# Patient Record
Sex: Female | Born: 1960 | Race: White | Hispanic: No | Smoking: Current every day smoker
Health system: Southern US, Community
[De-identification: ages and names within clinical notes are randomized; demographics above are authoritative.]

## PROBLEM LIST (undated history)

## (undated) DIAGNOSIS — F419 Anxiety disorder, unspecified: Secondary | ICD-10-CM

## (undated) DIAGNOSIS — I10 Essential (primary) hypertension: Secondary | ICD-10-CM

## (undated) DIAGNOSIS — F329 Major depressive disorder, single episode, unspecified: Secondary | ICD-10-CM

## (undated) DIAGNOSIS — E079 Disorder of thyroid, unspecified: Secondary | ICD-10-CM

## (undated) DIAGNOSIS — G43909 Migraine, unspecified, not intractable, without status migrainosus: Secondary | ICD-10-CM

## (undated) DIAGNOSIS — F32A Depression, unspecified: Secondary | ICD-10-CM

## (undated) DIAGNOSIS — R569 Unspecified convulsions: Secondary | ICD-10-CM

## (undated) DIAGNOSIS — R51 Headache: Secondary | ICD-10-CM

## (undated) HISTORY — PX: ABDOMINAL HYSTERECTOMY: SHX81

## (undated) HISTORY — DX: Essential (primary) hypertension: I10

## (undated) HISTORY — DX: Headache: R51

## (undated) HISTORY — DX: Major depressive disorder, single episode, unspecified: F32.9

## (undated) HISTORY — DX: Depression, unspecified: F32.A

## (undated) HISTORY — PX: BREAST LUMPECTOMY: SHX2

## (undated) HISTORY — PX: CHOLECYSTECTOMY: SHX55

---

## 2003-03-23 ENCOUNTER — Emergency Department (HOSPITAL_COMMUNITY): Admission: EM | Admit: 2003-03-23 | Discharge: 2003-03-24 | Payer: Self-pay | Admitting: Emergency Medicine

## 2012-12-07 ENCOUNTER — Emergency Department (INDEPENDENT_AMBULATORY_CARE_PROVIDER_SITE_OTHER)
Admission: EM | Admit: 2012-12-07 | Discharge: 2012-12-07 | Disposition: A | Discharge status: Home / Self Care | Payer: PRIVATE HEALTH INSURANCE | Source: Home / Self Care | Attending: Emergency Medicine | Admitting: Emergency Medicine

## 2012-12-07 ENCOUNTER — Encounter (HOSPITAL_COMMUNITY): Payer: Self-pay

## 2012-12-07 DIAGNOSIS — G40909 Epilepsy, unspecified, not intractable, without status epilepticus: Secondary | ICD-10-CM

## 2012-12-07 HISTORY — DX: Disorder of thyroid, unspecified: E07.9

## 2012-12-07 HISTORY — DX: Anxiety disorder, unspecified: F41.9

## 2012-12-07 HISTORY — DX: Unspecified convulsions: R56.9

## 2012-12-07 HISTORY — DX: Migraine, unspecified, not intractable, without status migrainosus: G43.909

## 2012-12-07 MED ORDER — ZIPRASIDONE HCL 40 MG PO CAPS: 40.0000 mg | ORAL_CAPSULE | Freq: Two times a day (BID) | ORAL | Status: DC

## 2012-12-07 MED ORDER — LEVOTHYROXINE SODIUM 75 MCG PO TABS: 75.0000 ug | ORAL_TABLET | Freq: Every day | ORAL | Status: DC

## 2012-12-07 MED ORDER — TOPIRAMATE 100 MG PO TABS: 150.0000 mg | ORAL_TABLET | Freq: Two times a day (BID) | ORAL | Status: DC

## 2012-12-07 MED ORDER — ATENOLOL 25 MG PO TABS: 25.0000 mg | ORAL_TABLET | Freq: Every day | ORAL | Status: DC

## 2012-12-07 NOTE — ED Notes (Signed)
Discussed limitations in the [facility] for long term controlled substance Rx, and need for follow up w a PCP for her psych medications and pain control issuse

## 2012-12-07 NOTE — ED Provider Notes (Signed)
CSN: [id number]     Arrival date & time 12/07/12  1403 History   First MD Initiated Contact with Patient 12/07/12 1553     Chief Complaint  Patient presents with  . Medication Refill   (Consider location/radiation/quality/duration/timing/severity/associated sxs/prior Treatment) Patient is a 52 y.o. female presenting with seizures. The history is provided by the patient. No language interpreter was used.  Seizures History of seizures: yes   Pt reports she moved here from [place].  Pt reports she did not bring her medications.  Pt is requesting medications  Past Medical History  Diagnosis Date  . Thyroid disease   . Seizures   . Anxiety   . Migraine    Past Surgical History  Procedure Laterality Date  . Abdominal hysterectomy    . Cholecystectomy    . Breast lumpectomy     History reviewed. No pertinent family history. History  Substance Use Topics  . Smoking status: Current Every Day Smoker  . Smokeless tobacco: Not on file  . Alcohol Use: No   OB History   Grav Para Term Preterm Abortions TAB SAB Ect Mult Living                 Review of Systems  Neurological: Positive for seizures.  All other systems reviewed and are negative.    Allergies  Penicillins and Sulfa antibiotics  Home Medications   Current Outpatient Rx  Name  Route  Sig  Dispense  Refill  . ALPRAZolam (XANAX) 1 MG tablet   Oral   Take 1 mg by mouth 3 (three) times daily as needed for sleep.         Marland Kitchen HYDROcodone-acetaminophen (LORCET) 10-650 MG per tablet   Oral   Take 1 tablet by mouth every 6 (six) hours as needed for pain.         Marland Kitchen atenolol (TENORMIN) 25 MG tablet   Oral   Take 1 tablet (25 mg total) by mouth daily.   15 tablet   0   . levothyroxine (SYNTHROID, LEVOTHROID) 75 MCG tablet   Oral   Take 1 tablet (75 mcg total) by mouth daily before breakfast.   15 tablet   0   . topiramate (TOPAMAX) 100 MG tablet   Oral   Take 1.5 tablets (150 mg total) by mouth 2 (two) times  daily.   30 tablet   0   . ziprasidone (GEODON) 40 MG capsule   Oral   Take 1 capsule (40 mg total) by mouth 2 (two) times daily with a meal.   30 capsule   0    BP 132/91  Pulse 79  Temp(Src) 97.9 F (36.6 C) (Oral)  Resp 12  SpO2 97% Physical Exam  Vitals reviewed. Constitutional: She appears well-developed and well-nourished.  HENT:  Head: Normocephalic.  Cardiovascular: Normal rate.   Pulmonary/Chest: Effort normal.  Musculoskeletal: Normal range of motion.  Neurological: She is alert.  Skin: Skin is warm.    ED Course  Procedures (including critical care time) Labs Review Labs Reviewed - No data to display Imaging Review No results found.  MDM   1. Seizure disorder     I discussed with Dr. Artis Flock.   I will give pt 2 week supply of noncontrolled medication.   I advised her this Urgent care will not prescribe the xanax or hydrocodone.   Pt advised she needs to get a primary MD here to manage her.      Elson Areas, PA-C 12/07/12  1621 

## 2012-12-07 NOTE — ED Notes (Signed)
Here from [place], needs Rx

## 2012-12-07 NOTE — ED Provider Notes (Signed)
Medical screening examination/treatment/procedure(s) were performed by resident physician or non-physician practitioner and as supervising physician I was immediately available for consultation/collaboration.   Tadeo Besecker DOUGLAS MD.   Jerrick Farve D Jaydan Meidinger, MD 12/07/12 1718 

## 2013-01-31 ENCOUNTER — Encounter: Payer: Self-pay | Admitting: Neurology

## 2013-02-01 ENCOUNTER — Ambulatory Visit (INDEPENDENT_AMBULATORY_CARE_PROVIDER_SITE_OTHER): Payer: Medicaid Other | Admitting: Neurology

## 2013-02-01 ENCOUNTER — Encounter: Payer: Self-pay | Admitting: Neurology

## 2013-02-01 ENCOUNTER — Encounter (INDEPENDENT_AMBULATORY_CARE_PROVIDER_SITE_OTHER): Payer: Self-pay

## 2013-02-01 VITALS — BP 144/84 | HR 68 | Ht 61.0 in | Wt 130.0 lb

## 2013-02-01 DIAGNOSIS — R51 Headache: Secondary | ICD-10-CM

## 2013-02-01 DIAGNOSIS — R569 Unspecified convulsions: Secondary | ICD-10-CM

## 2013-02-01 DIAGNOSIS — R519 Headache, unspecified: Secondary | ICD-10-CM | POA: Insufficient documentation

## 2013-02-01 HISTORY — DX: Headache: R51

## 2013-02-01 MED ORDER — QUETIAPINE FUMARATE 50 MG PO TABS: ORAL_TABLET | ORAL | Status: DC

## 2013-02-01 NOTE — Patient Instructions (Signed)

## 2013-02-01 NOTE — Progress Notes (Signed)
Reason for visit: Headache  Sherri Collins is a 52 y.o. female  History of present illness:  Sherri Collins is a 52 year old right-handed white female with a history of significant psychiatric illness. The patient has depression and anxiety, and she indicates that she has been hospitalized in the past for this. The patient last was in the hospital about one year ago. The patient comes into our office indicating that she has a history of headaches that are daily in nature. The patient indicates that the headaches have been present throughout her entire life starting as a child. The patient has a sensation as if her head is detached from her body, with a balloon-like sensation in the head with occasional squeezing sensations in the front of the head. The headaches are generalized in nature, sometimes associated with neck stiffness. The patient indicates that she may occasionally have some nausea with the headache. The patient has moved from [place], and she was taking Lorcet and Xanax at that time, but this has not been continued locally. The patient reports tingling sensations throughout the body. The patient reports a history of stress induced seizures that may be generalized in nature, with the last seizure on 12/05/2012. The patient is on Topamax taking 200 mg twice daily. The patient also takes gabapentin 300 mg 4 times daily. The patient is sent to this office for further evaluation. The patient has history of concussions.  Past Medical History  Diagnosis Date  . Thyroid disease   . Seizures   . Anxiety   . Migraine   . Headache(784.0) 02/01/2013  . Depression   . Hypertension     Past Surgical History  Procedure Laterality Date  . Abdominal hysterectomy    . Cholecystectomy    . Breast lumpectomy      Family History  Problem Relation Age of Onset  . Adopted: Yes    Social history:  reports that she has been smoking.  She has never used smokeless tobacco. She reports that she  does not drink alcohol. Her drug history is not on file.  Medications:  Current Outpatient Prescriptions on File Prior to Visit  Medication Sig Dispense Refill  . atenolol (TENORMIN) 25 MG tablet Take 1 tablet (25 mg total) by mouth daily.  15 tablet  0  . levothyroxine (SYNTHROID, LEVOTHROID) 75 MCG tablet Take 1 tablet (75 mcg total) by mouth daily before breakfast.  15 tablet  0   No current facility-administered medications on file prior to visit.      Allergies  Allergen Reactions  . Penicillins Swelling  . Sulfa Antibiotics Swelling    ROS:  Out of a complete 14 system review of symptoms, the patient complains only of the following symptoms, and all other reviewed systems are negative.  Memory loss, headache, numbness, slurred speech, seizure, passing out Insomnia Depression, anxiety, suicidal thoughts, hallucinations, racing thoughts  Blood pressure 144/84, pulse 68, height 5\' 1"  (1.549 m), weight 130 lb (58.968 kg).  Physical Exam  General: The patient is alert and cooperative at the time of the examination.  Head: Pupils are equal, round, and reactive to light. Discs are flat bilaterally.  Neck: The neck is supple, no carotid bruits are noted.  Respiratory: The respiratory examination is clear.  Cardiovascular: The cardiovascular examination reveals a regular rate and rhythm, no obvious murmurs or rubs are noted.  Neuromuscular: Range of movement of the cervical spine is full and normal. No crepitus is noted in the temporomandibular joints.  Skin:  Extremities are without significant edema.  Neurologic Exam  Mental status: The patient is alert and oriented x 3.  Cranial nerves: Facial symmetry is present. There is good sensation of the face to pinprick and soft touch bilaterally. The strength of the facial muscles and the muscles to head turning and shoulder shrug are normal bilaterally. Speech is well enunciated, no aphasia or dysarthria is noted. Extraocular  movements are full. Visual fields are full.  Motor: The motor testing reveals 5 over 5 strength of all 4 extremities. Good symmetric motor tone is noted throughout.  Sensory: Sensory testing is intact to pinprick, soft touch, vibration sensation, and position sense on all 4 extremities. No evidence of extinction is noted.  Coordination: Cerebellar testing reveals good finger-nose-finger and heel-to-shin bilaterally.  Gait and station: Gait is normal. Tandem gait is normal. Romberg is negative. No drift is seen.  Reflexes: Deep tendon reflexes are symmetric and normal bilaterally. Toes are downgoing bilaterally.   Assessment/Plan:  1. Muscle tension headache  2. Depression and anxiety  The patient has significant psychiatric disease that is underlying her chronic pain syndrome. The patient reports some occasional hallucinations. The patient has had suicidal ideation in the past, but she denies any recent issues in this regard. The patient has insomnia. For this reason, the patient will be started on Seroquel for her sleep disorder, for her depression, and for her muscle tension headache. The patient will followup through this office in 3-4 months. The patient will contact our office if dosing adjustments need to be done. Use of potentially addictive medication such as hydrocodone is not indicated.   Marlan Palau MD 02/01/2013 5:58 PM  [facility] [address] [place], [place] [address]  Phone [phone] Fax [phone]

## 2013-03-10 ENCOUNTER — Encounter (HOSPITAL_COMMUNITY): Payer: Self-pay | Admitting: Emergency Medicine

## 2013-03-10 ENCOUNTER — Emergency Department (HOSPITAL_COMMUNITY): Payer: Medicaid Other

## 2013-03-10 ENCOUNTER — Emergency Department (HOSPITAL_COMMUNITY)
Admission: EM | Admit: 2013-03-10 | Discharge: 2013-03-10 | Disposition: A | Discharge status: Home / Self Care | Payer: Medicaid Other | Attending: Emergency Medicine | Admitting: Emergency Medicine

## 2013-03-10 DIAGNOSIS — E079 Disorder of thyroid, unspecified: Secondary | ICD-10-CM | POA: Insufficient documentation

## 2013-03-10 DIAGNOSIS — Z882 Allergy status to sulfonamides status: Secondary | ICD-10-CM | POA: Insufficient documentation

## 2013-03-10 DIAGNOSIS — I10 Essential (primary) hypertension: Secondary | ICD-10-CM | POA: Insufficient documentation

## 2013-03-10 DIAGNOSIS — H9209 Otalgia, unspecified ear: Secondary | ICD-10-CM | POA: Insufficient documentation

## 2013-03-10 DIAGNOSIS — J40 Bronchitis, not specified as acute or chronic: Secondary | ICD-10-CM

## 2013-03-10 DIAGNOSIS — J209 Acute bronchitis, unspecified: Secondary | ICD-10-CM | POA: Insufficient documentation

## 2013-03-10 DIAGNOSIS — Z79899 Other long term (current) drug therapy: Secondary | ICD-10-CM | POA: Insufficient documentation

## 2013-03-10 DIAGNOSIS — J019 Acute sinusitis, unspecified: Secondary | ICD-10-CM | POA: Insufficient documentation

## 2013-03-10 DIAGNOSIS — F17200 Nicotine dependence, unspecified, uncomplicated: Secondary | ICD-10-CM | POA: Insufficient documentation

## 2013-03-10 DIAGNOSIS — R062 Wheezing: Secondary | ICD-10-CM | POA: Insufficient documentation

## 2013-03-10 DIAGNOSIS — Z8669 Personal history of other diseases of the nervous system and sense organs: Secondary | ICD-10-CM | POA: Insufficient documentation

## 2013-03-10 DIAGNOSIS — F329 Major depressive disorder, single episode, unspecified: Secondary | ICD-10-CM | POA: Insufficient documentation

## 2013-03-10 DIAGNOSIS — F411 Generalized anxiety disorder: Secondary | ICD-10-CM | POA: Insufficient documentation

## 2013-03-10 DIAGNOSIS — J329 Chronic sinusitis, unspecified: Secondary | ICD-10-CM

## 2013-03-10 DIAGNOSIS — Z880 Allergy status to penicillin: Secondary | ICD-10-CM | POA: Insufficient documentation

## 2013-03-10 DIAGNOSIS — M546 Pain in thoracic spine: Secondary | ICD-10-CM | POA: Insufficient documentation

## 2013-03-10 DIAGNOSIS — F3289 Other specified depressive episodes: Secondary | ICD-10-CM | POA: Insufficient documentation

## 2013-03-10 DIAGNOSIS — R569 Unspecified convulsions: Secondary | ICD-10-CM | POA: Insufficient documentation

## 2013-03-10 DIAGNOSIS — H9190 Unspecified hearing loss, unspecified ear: Secondary | ICD-10-CM | POA: Insufficient documentation

## 2013-03-10 LAB — CBC
Hemoglobin: 14.4 g/dL (ref 12.0–15.0)
MCH: 30.3 pg (ref 26.0–34.0)
RBC: 4.75 MIL/uL (ref 3.87–5.11)

## 2013-03-10 MED ORDER — ALBUTEROL SULFATE HFA 108 (90 BASE) MCG/ACT IN AERS
2.0000 | INHALATION_SPRAY | RESPIRATORY_TRACT | Status: DC | PRN
  Administered 2013-03-10: 2 via RESPIRATORY_TRACT
  Filled 2013-03-10: qty 6.7

## 2013-03-10 MED ORDER — HYDROCOD POLST-CHLORPHEN POLST 10-8 MG/5ML PO LQCR: 5.0000 mL | Freq: Two times a day (BID) | ORAL | Status: DC | PRN

## 2013-03-10 MED ORDER — AZITHROMYCIN 250 MG PO TABS: ORAL_TABLET | ORAL | Status: DC

## 2013-03-10 MED ORDER — ALBUTEROL SULFATE (5 MG/ML) 0.5% IN NEBU
5.0000 mg | INHALATION_SOLUTION | Freq: Once | RESPIRATORY_TRACT | Status: AC
  Administered 2013-03-10: 5 mg via RESPIRATORY_TRACT
  Filled 2013-03-10: qty 1

## 2013-03-10 MED ORDER — ACETAMINOPHEN 325 MG PO TABS
650.0000 mg | ORAL_TABLET | Freq: Once | ORAL | Status: AC
  Administered 2013-03-10: 650 mg via ORAL
  Filled 2013-03-10: qty 2

## 2013-03-10 NOTE — ED Provider Notes (Signed)
Medical screening examination/treatment/procedure(s) were conducted as a shared visit with non-physician practitioner(s) and myself.  I personally evaluated the patient during the encounter.  EKG Interpretation    Date/Time:  Friday March 10 2013 13:53:30 EST Ventricular Rate:  64 PR Interval:  141 QRS Duration: 87 QT Interval:  441 QTC Calculation: 455 R Axis:   64 Text Interpretation:  Sinus rhythm Baseline wander in lead(s) V3 No old tracing to compare Confirmed by [facility]  MD, CHARLES [phone]) on 03/10/2013 2:20:05 PM            Pt with bronchitis, likely sinusitis. No concern for clinically significant hemoptysis. CXR is clear. No anemia or leukocytosis. HFA, Z-pak, cough medicine and PCP followup.  Charles B. Bernette Mayers, MD 03/10/13 [phone]

## 2013-03-10 NOTE — ED Notes (Signed)
Pt c/o URI sx with cough and congestion and productive cough x 2 weeks; pt sts pain in bilateral ear and congestion with difficulty hearing

## 2013-03-10 NOTE — ED Provider Notes (Signed)
CSN: [id number]     Arrival date & time 03/10/13  1010 History   First MD Initiated Contact with Patient 03/10/13 1240    This chart was scribed for Mellody Drown PA-C, a non-physician practitioner working with Laray Anger, DO by Lewanda Rife, ED Scribe. This patient was seen in room TR06C/TR06C and the patient's care was started at 12:56 PM     Chief Complaint  Patient presents with  . URI  . Otalgia   (Consider location/radiation/quality/duration/timing/severity/associated sxs/prior Treatment) The history is provided by the patient. No language interpreter was used.   HPI Comments: Sherri Collins is a 52 y.o. female who presents to the Emergency Department complaining of several episodes of productive cough with brown sputum onset 2 weeks. Describes sputum as having "blood clots". Reports associated bilateral otalgia, nasal congestion, difficulty hearing, right upper back pain, and sinus pressure. Denies any aggravating or alleviating factors. Denies associated fever, emesis, nausea, and abdominal pain. Reports she smokes cigarettes daily (about 1 pack every 2 days). Past Medical History  Diagnosis Date  . Thyroid disease   . Seizures   . Anxiety   . Migraine   . Headache(784.0) 02/01/2013  . Depression   . Hypertension    Past Surgical History  Procedure Laterality Date  . Abdominal hysterectomy    . Cholecystectomy    . Breast lumpectomy     Family History  Problem Relation Age of Onset  . Adopted: Yes   History  Substance Use Topics  . Smoking status: Current Every Day Smoker  . Smokeless tobacco: Never Used  . Alcohol Use: No   OB History   Grav Para Term Preterm Abortions TAB SAB Ect Mult Living                 Review of Systems  Constitutional: Negative for fever.  HENT: Positive for congestion, hearing loss and sinus pressure.   Respiratory: Positive for cough.    A complete 10 system review of systems was obtained and all systems are  negative except as noted in the HPI and PMHx.    Allergies  Penicillins and Sulfa antibiotics  Home Medications   Current Outpatient Rx  Name  Route  Sig  Dispense  Refill  . atenolol (TENORMIN) 25 MG tablet   Oral   Take 1 tablet (25 mg total) by mouth daily.   15 tablet   0   . levothyroxine (SYNTHROID, LEVOTHROID) 75 MCG tablet   Oral   Take 1 tablet (75 mcg total) by mouth daily before breakfast.   15 tablet   0   . topiramate (TOPAMAX) 200 MG tablet   Oral   Take 200 mg by mouth 2 (two) times daily.          BP 160/103  Pulse 69  Temp(Src) 97.7 F (36.5 C) (Oral)  Resp 20  Ht 5\' 2"  (1.575 m)  Wt 130 lb (58.968 kg)  BMI 23.77 kg/m2  SpO2 99% Physical Exam  Nursing note and vitals reviewed. Constitutional: She is oriented to person, place, and time. She appears well-developed and well-nourished. No distress.  HENT:  Head: Normocephalic and atraumatic.  Right Ear: Tympanic membrane, external ear and ear canal normal.  Left Ear: Tympanic membrane, external ear and ear canal normal.  Nose: Mucosal edema and rhinorrhea present.  Mouth/Throat: Uvula is midline. Posterior oropharyngeal edema and posterior oropharyngeal erythema present. No oropharyngeal exudate.  Eyes: EOM are normal.  Neck: Neck supple.  Cardiovascular: Normal rate.  Pulmonary/Chest: Effort normal. No respiratory distress. She has wheezes.  Abdominal: Soft.  Musculoskeletal: Normal range of motion.  No TTP of right upper back  Pain was reproducible with movements   Neurological: She is alert and oriented to person, place, and time.  Skin: Skin is warm and dry.  Psychiatric: She has a normal mood and affect. Her behavior is normal.    ED Course  Procedures (including critical care time)  COORDINATION OF CARE:  Nursing notes reviewed. Vital signs reviewed. Initial pt interview and examination performed.   1:02 PM-Discussed work up plan with pt at bedside, which includes CXR. Pt agrees  with plan. 1:06 PM Nursing Notes Reviewed/ Care Coordinated Applicable Imaging Reviewed and incorporated into ED treatment Discussed results and treatment plan with pt. Pt demonstrates understanding and agrees with plan.   Treatment plan initiated:Medications - No data to display   Initial diagnostic testing ordered.    Labs Review Labs Reviewed - No data to display Imaging Review Dg Chest 2 View (if Patient Has Fever And/or Copd)  03/10/2013   CLINICAL DATA:  Upper respiratory symptoms, error 8, and history of tobacco use  EXAM: CHEST  2 VIEW  COMPARISON:  None.  FINDINGS: The lungs are borderline hyperinflated and clear. The cardiopericardial silhouette is normal in size. The mediastinum is normal in width. The pulmonary vascularity is not engorged. There is no pleural effusion or pneumothorax or pneumomediastinum. The observed portions of the bony thorax appear normal. Very gentle upper thoracic spine S-shaped curvature is present with the convexity towards the left.  IMPRESSION: There is no evidence of pneumonia nor CHF. Borderline hyperinflation may reflect acute bronchitis in the appropriate clinical setting.   Electronically Signed   By: David  [place]   On: 03/10/2013 11:20    EKG Interpretation   None       MDM  Pt with significant history of smoking, i felt she was not approptiately triaged and should be on the acute side.  Discussed patient history and condition with Dr. Bernette Mayers who agrees to take over the patient's care.  I personally performed the services described in this documentation, which was scribed in my presence. The recorded information has been reviewed and is accurate.    Clabe Seal, PA-C 03/22/13 [phone]

## 2013-03-10 NOTE — ED Notes (Signed)
Pt states that she started 2 weeks ago with nasal congestion. Now right ear "feels blocked". Also c/o right neck/shoulder pain radiating down right arm. Denies numbness or tingling.

## 2013-07-25 ENCOUNTER — Encounter (HOSPITAL_COMMUNITY): Payer: Self-pay | Admitting: Emergency Medicine

## 2013-07-25 ENCOUNTER — Emergency Department (HOSPITAL_COMMUNITY)
Admission: EM | Admit: 2013-07-25 | Discharge: 2013-07-25 | Disposition: A | Discharge status: Home / Self Care | Payer: Medicaid Other | Source: Home / Self Care

## 2013-07-25 DIAGNOSIS — K08531 Fractured dental restorative material with loss of material: Secondary | ICD-10-CM

## 2013-07-25 DIAGNOSIS — K0889 Other specified disorders of teeth and supporting structures: Secondary | ICD-10-CM

## 2013-07-25 DIAGNOSIS — K089 Disorder of teeth and supporting structures, unspecified: Secondary | ICD-10-CM

## 2013-07-25 MED ORDER — HYDROCODONE-ACETAMINOPHEN 5-325 MG PO TABS: 1.0000 | ORAL_TABLET | ORAL | Status: DC | PRN

## 2013-07-25 MED ORDER — CLINDAMYCIN HCL 300 MG PO CAPS: 300.0000 mg | ORAL_CAPSULE | Freq: Three times a day (TID) | ORAL | Status: DC

## 2013-07-25 NOTE — ED Provider Notes (Signed)
CSN: [id number]     Arrival date & time 07/25/13  1315 History   First MD Initiated Contact with Patient 07/25/13 1508     Chief Complaint  Patient presents with  . Dental Pain   (Consider location/radiation/quality/duration/timing/severity/associated sxs/prior Treatment) HPI Comments: 53 y o F with dental pain. Almost a yr ago she lost the crown that covered her right upper canine that is also associated with a fracture of the adjacent tooth mesial aspect. She has had intermittent pain but has become worse in the past couple of months. Saw her dentist 2 weeks ago and tx with abx and Tylenol #3. The pt was then advised by her dentist that if she continues to have dental pain to go to the ED. The dentist st could not provide additional tx or pain medications. The pt has been referred and appt rec'd with oral surgeon next month.    Past Medical History  Diagnosis Date  . Thyroid disease   . Seizures   . Anxiety   . Migraine   . Headache(784.0) 02/01/2013  . Depression   . Hypertension    Past Surgical History  Procedure Laterality Date  . Abdominal hysterectomy    . Cholecystectomy    . Breast lumpectomy     Family History  Problem Relation Age of Onset  . Adopted: Yes   History  Substance Use Topics  . Smoking status: Current Every Day Smoker  . Smokeless tobacco: Never Used  . Alcohol Use: No   OB History   Grav Para Term Preterm Abortions TAB SAB Ect Mult Living                 Review of Systems  Constitutional: Negative.   All other systems reviewed and are negative.   Allergies  Penicillins and Sulfa antibiotics  Home Medications   Prior to Admission medications   Medication Sig Start Date End Date Taking? Authorizing Provider  atenolol (TENORMIN) 25 MG tablet Take 1 tablet (25 mg total) by mouth daily. 12/07/12   Elson AreasLeslie K Sofia, PA-C  azithromycin [facility](ZITHROMAX) 250 MG tablet Use as directed 03/10/13   Charles B. Bernette MayersSheldon, MD  chlorpheniramine-HYDROcodone  [facility](TUSSIONEX PENNKINETIC ER) 10-8 MG/5ML LQCR Take 5 mLs by mouth every 12 (twelve) hours as needed for cough. 03/10/13   Charles B. Bernette MayersSheldon, MD  levothyroxine (SYNTHROID, LEVOTHROID) 75 MCG tablet Take 1 tablet (75 mcg total) by mouth daily before breakfast. 12/07/12   Elson AreasLeslie K Sofia, PA-C  topiramate (TOPAMAX) 200 MG tablet Take 200 mg by mouth 2 (two) times daily.    Historical Provider, MD   BP 141/90  Pulse 75  Temp(Src) 98 F (36.7 C) (Oral)  Resp 16  SpO2 99% Physical Exam  Nursing note and vitals reviewed. Constitutional: She is oriented to person, place, and time. She appears well-developed and well-nourished. No distress.  HENT:  Mouth/Throat: Oropharynx is clear and moist. No oropharyngeal exudate.  Loss of dental material and central cavity with pulp exposure to L upper canine. Minor swelling and erythema to associated gingiva.  Neurological: She is alert and oriented to person, place, and time. She exhibits normal muscle tone.  Skin: Skin is warm and dry.  Psychiatric: She has a normal mood and affect.    ED Course  Procedures (including critical care time) Labs Review Labs Reviewed - No data to display  Imaging Review No results found.   MDM   1. Pain, dental   2. Tooth fracture with loss of restorative material  norco 5 mg #20 clinda mycin 300 mg  Keep appt with oral surgeon    Hayden Rasmussenavid Mussa Groesbeck, NP 07/25/13 1642

## 2013-07-25 NOTE — ED Notes (Signed)
C/o tooth pain states left upper tooth lost its crown   States there is another tooth that is cracked in the middle States tooth is painful and sensitive.  Did see dentist on 07/06/13 and was given pain medication, antibiotic, and referral to oral surgeon   States next appt with oral surgeon is 08/09/13

## 2013-07-25 NOTE — Discharge Instructions (Signed)
Dental Pain °A tooth ache may be caused by cavities (tooth decay). Cavities expose the nerve of the tooth to air and hot or cold temperatures. It may come from an infection or abscess (also called a boil or furuncle) around your tooth. It is also often caused by dental caries (tooth decay). This causes the pain you are having. °DIAGNOSIS  °Your caregiver can diagnose this problem by exam. °TREATMENT  °· If caused by an infection, it may be treated with medications which kill germs (antibiotics) and pain medications as prescribed by your caregiver. Take medications as directed. °· Only take over-the-counter or prescription medicines for pain, discomfort, or fever as directed by your caregiver. °· Whether the tooth ache today is caused by infection or dental disease, you should see your dentist as soon as possible for further care. °SEEK MEDICAL CARE IF: °The exam and treatment you received today has been provided on an emergency basis only. This is not a substitute for complete medical or dental care. If your problem worsens or new problems (symptoms) appear, and you are unable to meet with your dentist, call or return to this location. °SEEK IMMEDIATE MEDICAL CARE IF:  °· You have a fever. °· You develop redness and swelling of your face, jaw, or neck. °· You are unable to open your mouth. °· You have severe pain uncontrolled by pain medicine. °MAKE SURE YOU:  °· Understand these instructions. °· Will watch your condition. °· Will get help right away if you are not doing well or get worse. °Document Released: 03/16/2005 Document Revised: 06/08/2011 Document Reviewed: 11/02/2007 °[employer]® Patient Information ©2014 [employer]. ° °Dental Care and Dentist Visits °Dental care supports good overall health. Regular dental visits can also help you avoid dental pain, bleeding, infection, and other more serious health problems in the future. It is important to keep the mouth healthy because diseases in the teeth, gums,  and other oral tissues can spread to other areas of the body. Some problems, such as diabetes, heart disease, and pre-term labor have been associated with poor oral health.  °See your dentist every 6 months. If you experience emergency problems such as a toothache or broken tooth, go to the dentist right away. If you see your dentist regularly, you may catch problems early. It is easier to be treated for problems in the early stages.  °WHAT TO EXPECT AT A DENTIST VISIT  °Your dentist will look for many common oral health problems and recommend proper treatment. At your regular dental visit, you can expect: °· Gentle cleaning of the teeth and gums. This includes scraping and polishing. This helps to remove the sticky substance around the teeth and gums (plaque). Plaque forms in the mouth shortly after eating. Over time, plaque hardens on the teeth as tartar. If tartar is not removed regularly, it can cause problems. Cleaning also helps remove stains. °· Periodic X-rays. These pictures of the teeth and supporting bone will help your dentist assess the health of your teeth. °· Periodic fluoride treatments. Fluoride is a natural mineral shown to help strengthen teeth. Fluoride treatment involves applying a fluoride gel or varnish to the teeth. It is most commonly done in children. °· Examination of the mouth, tongue, jaws, teeth, and gums to look for any oral health problems, such as: °· Cavities (dental caries). This is decay on the tooth caused by plaque, sugar, and acid in the mouth. It is best to catch a cavity when it is small. °· Inflammation of the gums   caused by plaque buildup (gingivitis). °· Problems with the mouth or malformed or misaligned teeth. °· Oral cancer or other diseases of the soft tissues or jaws.  °KEEP YOUR TEETH AND GUMS HEALTHY °For healthy teeth and gums, follow these general guidelines as well as your dentist's specific advice: °· Have your teeth professionally cleaned at the dentist every 6  months. °· Brush twice daily with a fluoride toothpaste. °· Floss your teeth daily.  °· Ask your dentist if you need fluoride supplements, treatments, or fluoride toothpaste. °· Eat a healthy diet. Reduce foods and drinks with added sugar. °· Avoid smoking. °TREATMENT FOR ORAL HEALTH PROBLEMS °If you have oral health problems, treatment varies depending on the conditions present in your teeth and gums. °· Your caregiver will most likely recommend good oral hygiene at each visit. °· For cavities, gingivitis, or other oral health disease, your caregiver will perform a procedure to treat the problem. This is typically done at a separate appointment. Sometimes your caregiver will refer you to another dental specialist for specific tooth problems or for surgery. °SEEK IMMEDIATE DENTAL CARE IF: °· You have pain, bleeding, or soreness in the gum, tooth, jaw, or mouth area. °· A permanent tooth becomes loose or separated from the gum socket. °· You experience a blow or injury to the mouth or jaw area. °Document Released: 11/26/2010 Document Revised: 06/08/2011 Document Reviewed: 11/26/2010 °Exit[employer]® Patient Information ©2014 [employer]. ° °

## 2013-07-26 NOTE — ED Provider Notes (Signed)
Medical screening examination/treatment/procedure(s) were performed by resident physician or non-physician practitioner and as supervising physician I was immediately available for consultation/collaboration.   Yandel Zeiner DOUGLAS MD.   Malita Ignasiak D Syrenity Klepacki, MD 07/26/13 1705 

## 2013-10-11 ENCOUNTER — Emergency Department (HOSPITAL_COMMUNITY)
Admission: EM | Admit: 2013-10-11 | Discharge: 2013-10-11 | Disposition: A | Discharge status: Home / Self Care | Payer: Medicaid Other | Attending: Emergency Medicine | Admitting: Emergency Medicine

## 2013-10-11 ENCOUNTER — Encounter (HOSPITAL_COMMUNITY): Payer: Self-pay | Admitting: Emergency Medicine

## 2013-10-11 DIAGNOSIS — G40909 Epilepsy, unspecified, not intractable, without status epilepticus: Secondary | ICD-10-CM | POA: Diagnosis not present

## 2013-10-11 DIAGNOSIS — Z880 Allergy status to penicillin: Secondary | ICD-10-CM | POA: Diagnosis not present

## 2013-10-11 DIAGNOSIS — I10 Essential (primary) hypertension: Secondary | ICD-10-CM | POA: Insufficient documentation

## 2013-10-11 DIAGNOSIS — F411 Generalized anxiety disorder: Secondary | ICD-10-CM | POA: Diagnosis not present

## 2013-10-11 DIAGNOSIS — F17200 Nicotine dependence, unspecified, uncomplicated: Secondary | ICD-10-CM | POA: Insufficient documentation

## 2013-10-11 DIAGNOSIS — F329 Major depressive disorder, single episode, unspecified: Secondary | ICD-10-CM | POA: Insufficient documentation

## 2013-10-11 DIAGNOSIS — Z79899 Other long term (current) drug therapy: Secondary | ICD-10-CM | POA: Diagnosis not present

## 2013-10-11 DIAGNOSIS — M25519 Pain in unspecified shoulder: Secondary | ICD-10-CM | POA: Insufficient documentation

## 2013-10-11 DIAGNOSIS — E079 Disorder of thyroid, unspecified: Secondary | ICD-10-CM | POA: Diagnosis not present

## 2013-10-11 DIAGNOSIS — M62838 Other muscle spasm: Secondary | ICD-10-CM | POA: Insufficient documentation

## 2013-10-11 DIAGNOSIS — F3289 Other specified depressive episodes: Secondary | ICD-10-CM | POA: Diagnosis not present

## 2013-10-11 DIAGNOSIS — G44209 Tension-type headache, unspecified, not intractable: Secondary | ICD-10-CM | POA: Insufficient documentation

## 2013-10-11 DIAGNOSIS — M546 Pain in thoracic spine: Secondary | ICD-10-CM | POA: Diagnosis present

## 2013-10-11 MED ORDER — NAPROXEN 500 MG PO TABS: 500.0000 mg | ORAL_TABLET | Freq: Two times a day (BID) | ORAL | Status: DC | PRN

## 2013-10-11 MED ORDER — METHOCARBAMOL 500 MG PO TABS: 500.0000 mg | ORAL_TABLET | Freq: Three times a day (TID) | ORAL | Status: DC | PRN

## 2013-10-11 MED ORDER — KETOROLAC TROMETHAMINE 30 MG/ML IJ SOLN
30.0000 mg | Freq: Once | INTRAMUSCULAR | Status: AC
  Administered 2013-10-11: 30 mg via INTRAMUSCULAR
  Filled 2013-10-11: qty 1

## 2013-10-11 MED ORDER — OXYCODONE-ACETAMINOPHEN 5-325 MG PO TABS: 1.0000 | ORAL_TABLET | Freq: Four times a day (QID) | ORAL | Status: AC | PRN

## 2013-10-11 NOTE — ED Notes (Signed)
PA at bedside.

## 2013-10-11 NOTE — ED Provider Notes (Signed)
CSN: [id number]     Arrival date & time 10/11/13  1447 History  This chart was scribed for No att. providers found by Evon Slack, ED Scribe. This patient was seen in room TR09C/TR09C and the patient's care was started at 4:10 PM.    Chief Complaint  Patient presents with  . Back Pain   HPI Comments: JAQUIA BENEDICTO is a 53 y.o. female with a  PMHx of anxiety, migraines, HTN, and thyroid disease who presents to the Emergency Department complaining of upper back pain onset 3 days. She states the pain started in her right shoulder and neck, felt like a knot in the muscles between her shoulder blade and neck, then migrated up her neck into her head, and some pain on the L thoracic back as well. She states that she has associated R upper shoulder pain, R sided neck pain, intermittent headache with some mild photophobia and nausea. She states that her pain is 10/10, intermittent, and tight in quality. She states the back pain radiates into her neck and her head. She states she has been taking acetaminophen and her sister's muscle relaxer's (Baclofen) yesterday with no relief. She state she also tried Aspercreme with no relief. She states that sitting for too long makes her pain worse. She denies chest pain, SOB, vision changes, abd pain, vomiting, bowel or bladder incontinence, paresthesias, numbness, or weakness. She states she has started taking cozaar for HTN last week. No injuries or falls, states she does exercise but had not done any increase or change in her routine. No recent changes in bedding or sleeping habits.  Patient is a 53 y.o. female presenting with back pain. The history is provided by the patient. No language interpreter was used.  Back Pain Location:  Thoracic spine Quality:  Burning Radiates to:  Does not radiate Pain severity:  Moderate Pain is:  Same all the time Onset quality:  Gradual Duration:  3 days Timing:  Intermittent Progression:  Unchanged Chronicity:   New Context: not recent injury and not twisting   Context comment:  Exercising Relieved by:  Nothing Worsened by:  Palpation and movement Ineffective treatments:  Ibuprofen, muscle relaxants and OTC medications Associated symptoms: headaches   Associated symptoms: no abdominal pain, no bladder incontinence, no bowel incontinence, no chest pain, no fever, no leg pain, no numbness, no paresthesias, no perianal numbness, no tingling and no weakness      Past Medical History  Diagnosis Date  . Thyroid disease   . Seizures   . Anxiety   . Migraine   . Headache(784.0) 02/01/2013  . Depression   . Hypertension    Past Surgical History  Procedure Laterality Date  . Abdominal hysterectomy    . Cholecystectomy    . Breast lumpectomy     Family History  Problem Relation Age of Onset  . Adopted: Yes   History  Substance Use Topics  . Smoking status: Current Every Day Smoker  . Smokeless tobacco: Never Used  . Alcohol Use: No   OB History   Grav Para Term Preterm Abortions TAB SAB Ect Mult Living                 Review of Systems  Constitutional: Negative for fever and chills.  Eyes: Positive for photophobia. Negative for pain and visual disturbance.  Respiratory: Negative for chest tightness and shortness of breath.   Cardiovascular: Negative for chest pain.  Gastrointestinal: Positive for nausea. Negative for vomiting, abdominal pain, diarrhea, constipation  and bowel incontinence.  Genitourinary: Negative for bladder incontinence.  Musculoskeletal: Positive for arthralgias, back pain and neck pain. Negative for gait problem, joint swelling, myalgias and neck stiffness.  Skin: Negative for color change.  Neurological: Positive for headaches. Negative for dizziness, tingling, weakness, numbness and paresthesias.  Psychiatric/Behavioral: Negative for confusion.  10 Systems reviewed and are negative for acute change except as noted in the HPI.   Allergies  Penicillins and  Sulfa antibiotics  Home Medications   Prior to Admission medications   Medication Sig Start Date End Date Taking? Authorizing Provider  aspirin-acetaminophen-caffeine (EXCEDRIN MIGRAINE) [phone] MG per tablet Take 1 tablet by mouth every 6 (six) hours as needed for headache. Also taking it for back pain per patient   Yes Historical Provider, MD  baclofen (LIORESAL) 20 MG tablet Take 20 mg by mouth once.    Yes Historical Provider, MD  buPROPion (WELLBUTRIN XL) 300 MG 24 hr tablet Take 300 mg by mouth daily.   Yes Historical Provider, MD  HYDROcodone-acetaminophen (NORCO/VICODIN) 5-325 MG per tablet Take 1 tablet by mouth every 4 (four) hours as needed for moderate pain. 07/25/13  Yes Hayden Rasmussen, NP  levothyroxine (SYNTHROID, LEVOTHROID) 75 MCG tablet Take 75 mcg by mouth daily before breakfast.   Yes Historical Provider, MD  losartan (COZAAR) 100 MG tablet Take 100 mg by mouth at bedtime.   Yes Historical Provider, MD  Melatonin 5 MG TABS Take 1 tablet by mouth at bedtime.   Yes Historical Provider, MD  prazosin (MINIPRESS) 1 MG capsule Take 1 mg by mouth at bedtime.   Yes Historical Provider, MD  propranolol (INDERAL) 80 MG tablet Take 80 mg by mouth at bedtime.   Yes Historical Provider, MD  topiramate (TOPAMAX) 200 MG tablet Take 200 mg by mouth 2 (two) times daily.   Yes Historical Provider, MD  zolpidem (AMBIEN) 10 MG tablet Take 5 mg by mouth at bedtime.   Yes Historical Provider, MD  methocarbamol (ROBAXIN) 500 MG tablet Take 1 tablet (500 mg total) by mouth every 8 (eight) hours as needed for muscle spasms. 10/11/13   Taylan Marez Strupp Camprubi-Soms, PA-C  naproxen (NAPROSYN) 500 MG tablet Take 1 tablet (500 mg total) by mouth 2 (two) times daily as needed for mild pain, moderate pain or headache (TAKE WITH MEALS.). 10/11/13   Cain Fitzhenry Strupp Camprubi-Soms, PA-C  oxyCODONE-acetaminophen (PERCOCET) 5-325 MG per tablet Take 1-2 tablets by mouth every 6 (six) hours as needed for severe pain.  10/11/13   Fines Kimberlin Strupp Camprubi-Soms, PA-C   Triage Vitals: BP 134/85  Pulse 81  Temp(Src) 97.9 F (36.6 C) (Oral)  Resp 18  SpO2 98%  Physical Exam  Nursing note and vitals reviewed. Constitutional: She is oriented to person, place, and time. Vital signs are normal. She appears well-developed and well-nourished. No distress.  HENT:  Head: Normocephalic and atraumatic.  Mouth/Throat: Mucous membranes are normal.  Eyes: Conjunctivae and EOM are normal. Pupils are equal, round, and reactive to light.  Neck: Normal range of motion. Neck supple. Muscular tenderness present. No spinous process tenderness present. No rigidity. No tracheal deviation and normal range of motion present.    FROM intact Paracervical muscle TTP and spasm on R side, in trapezius. No spinous process tenderness to palpation or bony step-offs. No erythema, edema, or meningeal signs.  Cardiovascular: Normal rate, regular rhythm, normal heart sounds, intact distal pulses and normal pulses.  Exam reveals no gallop.   No murmur heard. Regular rate and rhythm with no murmurs,  rubs, or gallops. Intact distal pulses.  Pulmonary/Chest: Effort normal and breath sounds normal. No respiratory distress. She has no decreased breath sounds. She has no wheezes. She has no rhonchi. She has no rales.  Clear to auscultation bilaterally. No wheezes, rhonchi, Rales.  Abdominal: Normal appearance. She exhibits no distension.  Musculoskeletal: Normal range of motion.       Right shoulder: She exhibits pain and spasm. She exhibits normal range of motion, no bony tenderness, no swelling, no effusion, no crepitus, no deformity and normal strength.       Left shoulder: Normal.       Right elbow: Normal.      Right wrist: Normal.       Cervical back: She exhibits pain and spasm. She exhibits normal range of motion and no bony tenderness.       Thoracic back: Normal.       Lumbar back: Normal.  R shoulder ROM intact. Trapezius spasm and  TTP on right size. No bony TTP or crepitus, no deformities to R shoulder. Neg apley scratch, neg empty can test, neg int/ext rotation with resistance. Strength 5/5 in all extremities. C-spine exam as above. Remaining spinal levels non-TTP without bony deformity or step offs. No midline spinal TTP.  Neurological: She is alert and oriented to person, place, and time. She has normal strength. No cranial nerve deficit or sensory deficit.  Sensation grossly intact in all extremities. Strength 5/5 in all extremities. CNII-XII grossly intact.  Skin: Skin is warm, dry and intact. No bruising noted. No erythema.  Psychiatric: Her behavior is normal. Her mood appears anxious.  Mildly anxious    ED Course  Procedures (including critical care time) DIAGNOSTIC STUDIES: Oxygen Saturation is 98% on RA, normal by my interpretation.    COORDINATION OF CARE: 4:29 PM-Discussed treatment plan which includes pain medication and muscle relaxers with pt at bedside and pt agreed to plan.     Labs Review Labs Reviewed - No data to display  Imaging Review No results found.   EKG Interpretation None      MDM   Final diagnoses:  Muscle spasms of neck  Tension-type headache, not intractable, unspecified chronicity pattern   Delorse LekDarien L Romey is a 53 y.o. female with a PMHx of anxiety, HTN, and headaches presenting with R trapezius muscle tightness and pain extending into her neck and causing a headache. Spasm noted on exam which is TTP. No midline bony TTP. No red flag s/sxs. No s/sx of cord compression. I believe this pain and headache is related to a muscle strain leading to a spasm in her paraspinous muscles, likely related to her exercise routine. Advised pt to use robaxin, percocet, naprosyn, and heat for pain control. Discussed avoiding caffeine containing meds due to pt's HTN. Will have pt f/up with PCP in 1 wk to ensure resolution of symptoms. No imaging required at this time. No red flag neuro  deficits, headache is likely related to pain in back/neck. I explained the diagnosis and have given explicit precautions to return to the ER including for any other new or worsening symptoms. The patient understands and accepts the medical plan as it's been dictated and I have answered their questions. Discharge instructions concerning home care and prescriptions have been given. The patient is STABLE and is discharged to home in good condition.   I personally performed the services described in this documentation, which was scribed in my presence. The recorded information has been reviewed and is accurate.  BP 126/91  Pulse 62  Temp(Src) 97.7 F (36.5 C) (Oral)  Resp 16  SpO2 98%   Donnita Falls Camprubi-Soms, PA-C 10/11/13 2052

## 2013-10-11 NOTE — ED Notes (Signed)
Pt reports pain started as a knot between right shoulder and neck, now pain is across her neck and shoulders, causing a headache. No acute distress noted at triage. Denies any injury to back.

## 2013-10-11 NOTE — Discharge Instructions (Signed)
Back Pain:  Your back pain should be treated with medicines such as naprosyn or percocet and this back pain should get better over the next 2 weeks.  However if you develop severe or worsening pain, low back pain with fever, numbness, weakness or inability to walk or urinate, you should return to the ER immediately.  Please follow up with your doctor this week for a recheck if still having symptoms.  Low back pain is discomfort in the lower back that may be due to injuries to muscles and ligaments around the spine.  Occasionally, it may be caused by a a problem to a part of the spine called a disc.  The pain may last several days or a week;  However, most patients get completely well in 4 weeks.  Self - care:  The application of heat can help soothe the pain.  Maintaining your daily activities, including walking, is encourged, as it will help you get better faster than just staying in bed. Perform gentle stretching as discussed. Drink plenty of fluids.  Medications are also useful to help with pain control.  A commonly prescribed medication is Percocet. Do not drive or operate heavy machinery while taking this medication. Do not take additional tylenol.  Non steroidal anti inflammatory medications including Ibuprofen and naproxen;  These medications help both pain and swelling and are very useful in treating back pain.  They should be taken with food, as they can cause stomach upset, and more seriously, stomach bleeding.    Muscle relaxants (Robaxin):  These medications can help with muscle tightness that is a cause of lower back pain.  Most of these medications can cause drowsiness, and it is not safe to drive or use dangerous machinery while taking them.  SEEK IMMEDIATE MEDICAL ATTENTION IF: New numbness, tingling, weakness, or problem with the use of your arms or legs.  Severe back pain not relieved with medications.  Difficulty with or loss of control of your bowel or bladder control.    Increasing pain in any areas of the body (such as chest or abdominal pain).  Shortness of breath, dizziness or fainting.  Nausea (feeling sick to your stomach), vomiting, fever, or sweats.  You will need to follow up with  Your primary healthcare provider in 1-2 weeks for reassessment.   Muscle Cramps and Spasms Muscle cramps and spasms occur when a muscle or muscles tighten and you have no control over this tightening (involuntary muscle contraction). They are a common problem and can develop in any muscle. The most common place is in the calf muscles of the leg. Both muscle cramps and muscle spasms are involuntary muscle contractions, but they also have differences:   Muscle cramps are sporadic and painful. They may last a few seconds to a quarter of an hour. Muscle cramps are often more forceful and last longer than muscle spasms.  Muscle spasms may or may not be painful. They may also last just a few seconds or much longer. CAUSES  It is uncommon for cramps or spasms to be due to a serious underlying problem. In many cases, the cause of cramps or spasms is unknown. Some common causes are:   Overexertion.   Overuse from repetitive motions (doing the same thing over and over).   Remaining in a certain position for a long period of time.   Improper preparation, form, or technique while performing a sport or activity.   Dehydration.   Injury.   Side effects of some medicines.  Abnormally low levels of the salts and ions in your blood (electrolytes), especially potassium and calcium. This could happen if you are taking water pills (diuretics) or you are pregnant.  Some underlying medical problems can make it more likely to develop cramps or spasms. These include, but are not limited to:   Diabetes.   Parkinson disease.   Hormone disorders, such as thyroid problems.   Alcohol abuse.   Diseases specific to muscles, joints, and bones.   Blood vessel disease  where not enough blood is getting to the muscles.  HOME CARE INSTRUCTIONS   Stay well hydrated. Drink enough water and fluids to keep your urine clear or pale yellow.  It may be helpful to massage, stretch, and relax the affected muscle.  For tight or tense muscles, use a warm towel, heating pad, or hot shower water directed to the affected area.  If you are sore or have pain after a cramp or spasm, applying ice to the affected area may relieve discomfort.  Put ice in a plastic bag.  Place a towel between your skin and the bag.  Leave the ice on for 15-20 minutes, 03-04 times a day.  Medicines used to treat a known cause of cramps or spasms may help reduce their frequency or severity. Only take over-the-counter or prescription medicines as directed by your caregiver. SEEK MEDICAL CARE IF:  Your cramps or spasms get more severe, more frequent, or do not improve over time.  MAKE SURE YOU:   Understand these instructions.  Will watch your condition.  Will get help right away if you are not doing well or get worse. Document Released: 09/05/2001 Document Revised: 07/11/2012 Document Reviewed: 03/02/2012 [facility] Patient Information 2015 [place], [place]. This information is not intended to replace advice given to you by your health care provider. Make sure you discuss any questions you have with your health care provider.  Heat Therapy Heat therapy can help make painful, stiff muscles and joints feel better. Do not use heat on new injuries. Wait at least 48 hours after an injury to use heat. Do not use heat when you have aches or pains right after an activity. If you still have pain 3 hours after stopping the activity, then you may use heat. HOME CARE Wet heat pack  Soak a clean towel in warm water. Squeeze out the extra water.  Put the warm, wet towel in a plastic bag.  Place a thin, dry towel between your skin and the bag.  Put the heat pack on the area for 5 minutes, and check  your skin. Your skin may be pink, but it should not be red.  Leave the heat pack on the area for 15 to 30 minutes.  Repeat this every 2 to 4 hours while awake. Do not use heat while you are sleeping. Warm water bath  Fill a tub with warm water.  Place the affected body part in the tub.  Soak the area for 20 to 40 minutes.  Repeat as needed. Hot water bottle  Fill the water bottle half full with hot water.  Press out the extra air. Close the cap tightly.  Place a dry towel between your skin and the bottle.  Put the bottle on the area for 5 minutes, and check your skin. Your skin may be pink, but it should not be red.  Leave the bottle on the area for 15 to 30 minutes.  Repeat this every 2 to 4 hours while awake. Mining engineer  heating pad  Place a dry towel between your skin and the heating pad.  Set the heating pad on low heat.  Put the heating pad on the area for 10 minutes, and check your skin. Your skin may be pink, but it should not be red.  Leave the heating pad on the area for 20 to 40 minutes.  Repeat this every 2 to 4 hours while awake.  Do not lie on the heating pad.  Do not fall asleep while using the heating pad.  Do not use the heating pad near water. GET HELP RIGHT AWAY IF:  You get blisters or red skin.  Your skin is puffy (swollen), or you lose feeling (numbness) in the affected area.  You have any new problems.  Your problems are getting worse.  You have any questions or concerns. If you have any problems, stop using heat therapy until you see your doctor. MAKE SURE YOU:  Understand these instructions.  Will watch your condition.  Will get help right away if you are not doing well or get worse. Document Released: 06/08/2011 Document Reviewed: 06/08/2011 [facility] Patient Information 2015 [place], [place]. This information is not intended to replace advice given to you by your health care provider. Make sure you discuss any questions you have  with your health care provider.  Tension Headache A tension headache is a feeling of pain, pressure, or aching often felt over the front and sides of the head. The pain can be dull or can feel tight (constricting). It is the most common type of headache. Tension headaches are not normally associated with nausea or vomiting and do not get worse with physical activity. Tension headaches can last 30 minutes to several days.  CAUSES  The exact cause is not known, but it may be caused by chemicals and hormones in the brain that lead to pain. Tension headaches often begin after stress, anxiety, or depression. Other triggers may include:  Alcohol.  Caffeine (too much or withdrawal).  Respiratory infections (colds, flu, sinus infections).  Dental problems or teeth clenching.  Fatigue.  Holding your head and neck in one position too long while using a computer. SYMPTOMS   Pressure around the head.   Dull, aching head pain.   Pain felt over the front and sides of the head.   Tenderness in the muscles of the head, neck, and shoulders. DIAGNOSIS  A tension headache is often diagnosed based on:   Symptoms.   Physical examination.   A CT scan or MRI of your head. These tests may be ordered if symptoms are severe or unusual. TREATMENT  Medicines may be given to help relieve symptoms.  HOME CARE INSTRUCTIONS   Only take over-the-counter or prescription medicines for pain or discomfort as directed by your caregiver.   Lie down in a dark, quiet room when you have a headache.   Keep a journal to find out what may be triggering your headaches. For example, write down:  What you eat and drink.  How much sleep you get.  Any change to your diet or medicines.  Try massage or other relaxation techniques.   Ice packs or heat applied to the head and neck can be used. Use these 3 to 4 times per day for 15 to 20 minutes each time, or as needed.   Limit stress.   Sit up straight,  and do not tense your muscles.   Quit smoking if you smoke.  Limit alcohol use.  Decrease the  amount of caffeine you drink, or stop drinking caffeine.  Eat and exercise regularly.  Get 7 to 9 hours of sleep, or as recommended by your caregiver.  Avoid excessive use of pain medicine as recurrent headaches can occur.  SEEK MEDICAL CARE IF:   You have problems with the medicines you were prescribed.  Your medicines do not work.  You have a change from the usual headache.  You have nausea or vomiting. SEEK IMMEDIATE MEDICAL CARE IF:   Your headache becomes severe.  You have a fever.  You have a stiff neck.  You have loss of vision.  You have muscular weakness or loss of muscle control.  You lose your balance or have trouble walking.  You feel faint or pass out.  You have severe symptoms that are different from your first symptoms. MAKE SURE YOU:   Understand these instructions.  Will watch your condition.  Will get help right away if you are not doing well or get worse. Document Released: 03/16/2005 Document Revised: 06/08/2011 Document Reviewed: 03/06/2011 [facility] Patient Information 2015 [place]. This information is not intended to replace advice given to you by your health care provider. Make sure you discuss any questions you have with your health care provider.

## 2013-10-12 NOTE — ED Provider Notes (Signed)
Medical screening examination/treatment/procedure(s) were performed by non-physician practitioner and as supervising physician I was immediately available for consultation/collaboration.   EKG Interpretation None        Klynn Linnemann M Rayyan Orsborn, DO 10/12/13 1435 

## 2013-11-23 ENCOUNTER — Other Ambulatory Visit: Payer: Self-pay | Admitting: Nurse Practitioner

## 2013-11-23 DIAGNOSIS — Z1231 Encounter for screening mammogram for malignant neoplasm of breast: Secondary | ICD-10-CM

## 2013-12-01 ENCOUNTER — Encounter (HOSPITAL_COMMUNITY): Payer: Self-pay | Admitting: Emergency Medicine

## 2013-12-01 ENCOUNTER — Emergency Department (HOSPITAL_COMMUNITY): Payer: Medicaid Other

## 2013-12-01 ENCOUNTER — Emergency Department (HOSPITAL_COMMUNITY)
Admission: EM | Admit: 2013-12-01 | Discharge: 2013-12-01 | Disposition: A | Discharge status: Home / Self Care | Payer: Medicaid Other | Attending: Emergency Medicine | Admitting: Emergency Medicine

## 2013-12-01 DIAGNOSIS — F411 Generalized anxiety disorder: Secondary | ICD-10-CM | POA: Diagnosis not present

## 2013-12-01 DIAGNOSIS — Z7982 Long term (current) use of aspirin: Secondary | ICD-10-CM | POA: Diagnosis not present

## 2013-12-01 DIAGNOSIS — G43909 Migraine, unspecified, not intractable, without status migrainosus: Secondary | ICD-10-CM | POA: Insufficient documentation

## 2013-12-01 DIAGNOSIS — R519 Headache, unspecified: Secondary | ICD-10-CM

## 2013-12-01 DIAGNOSIS — G40909 Epilepsy, unspecified, not intractable, without status epilepticus: Secondary | ICD-10-CM | POA: Insufficient documentation

## 2013-12-01 DIAGNOSIS — Z79899 Other long term (current) drug therapy: Secondary | ICD-10-CM | POA: Insufficient documentation

## 2013-12-01 DIAGNOSIS — F3289 Other specified depressive episodes: Secondary | ICD-10-CM | POA: Diagnosis not present

## 2013-12-01 DIAGNOSIS — E079 Disorder of thyroid, unspecified: Secondary | ICD-10-CM | POA: Insufficient documentation

## 2013-12-01 DIAGNOSIS — I10 Essential (primary) hypertension: Secondary | ICD-10-CM | POA: Diagnosis not present

## 2013-12-01 DIAGNOSIS — F17200 Nicotine dependence, unspecified, uncomplicated: Secondary | ICD-10-CM | POA: Diagnosis not present

## 2013-12-01 DIAGNOSIS — Z880 Allergy status to penicillin: Secondary | ICD-10-CM | POA: Insufficient documentation

## 2013-12-01 DIAGNOSIS — R51 Headache: Secondary | ICD-10-CM | POA: Diagnosis present

## 2013-12-01 DIAGNOSIS — F329 Major depressive disorder, single episode, unspecified: Secondary | ICD-10-CM | POA: Diagnosis not present

## 2013-12-01 DIAGNOSIS — R1110 Vomiting, unspecified: Secondary | ICD-10-CM | POA: Insufficient documentation

## 2013-12-01 LAB — COMPREHENSIVE METABOLIC PANEL
ALT: 41 U/L — ABNORMAL HIGH (ref 0–35)
AST: 44 U/L — ABNORMAL HIGH (ref 0–37)
Albumin: 3.7 g/dL (ref 3.5–5.2)
Alkaline Phosphatase: 66 U/L (ref 39–117)
Anion gap: 15 (ref 5–15)
BUN: 16 mg/dL (ref 6–23)
CALCIUM: 9.2 mg/dL (ref 8.4–10.5)
CO2: 20 mEq/L (ref 19–32)
CREATININE: 0.9 mg/dL (ref 0.50–1.10)
Chloride: 105 mEq/L (ref 96–112)
GFR calc Af Amer: 83 mL/min — ABNORMAL LOW (ref >90)
GFR, EST NON AFRICAN AMERICAN: 71 mL/min — AB (ref >90)
Glucose, Bld: 80 mg/dL (ref 70–99)
Potassium: 5.2 mEq/L (ref 3.7–5.3)
Sodium: 140 mEq/L (ref 137–147)
Total Bilirubin: <0.2 mg/dL — ABNORMAL LOW (ref 0.3–1.2)
Total Protein: 7 g/dL (ref 6.0–8.3)

## 2013-12-01 LAB — CBC WITH DIFFERENTIAL/PLATELET
BASOS ABS: 0 10*3/uL (ref 0.0–0.1)
Basophils Relative: 1 % (ref 0–1)
EOS PCT: 2 % (ref 0–5)
Eosinophils Absolute: 0.2 10*3/uL (ref 0.0–0.7)
HEMATOCRIT: 41.3 % (ref 36.0–46.0)
Hemoglobin: 13.9 g/dL (ref 12.0–15.0)
LYMPHS PCT: 27 % (ref 12–46)
Lymphs Abs: 1.9 10*3/uL (ref 0.7–4.0)
MCH: 29.8 pg (ref 26.0–34.0)
MCHC: 33.7 g/dL (ref 30.0–36.0)
MCV: 88.6 fL (ref 78.0–100.0)
MONO ABS: 0.6 10*3/uL (ref 0.1–1.0)
Monocytes Relative: 8 % (ref 3–12)
Neutro Abs: 4.4 10*3/uL (ref 1.7–7.7)
Neutrophils Relative %: 62 % (ref 43–77)
Platelets: 309 10*3/uL (ref 150–400)
RBC: 4.66 MIL/uL (ref 3.87–5.11)
RDW: 13.3 % (ref 11.5–15.5)
WBC: 7 10*3/uL (ref 4.0–10.5)

## 2013-12-01 LAB — URINALYSIS, ROUTINE W REFLEX MICROSCOPIC
Bilirubin Urine: NEGATIVE
Glucose, UA: NEGATIVE mg/dL
Hgb urine dipstick: NEGATIVE
Ketones, ur: NEGATIVE mg/dL
LEUKOCYTES UA: NEGATIVE
NITRITE: NEGATIVE
PROTEIN: NEGATIVE mg/dL
Specific Gravity, Urine: 1.006 (ref 1.005–1.030)
UROBILINOGEN UA: 0.2 mg/dL (ref 0.0–1.0)
pH: 5.5 (ref 5.0–8.0)

## 2013-12-01 LAB — RAPID URINE DRUG SCREEN, HOSP PERFORMED
Amphetamines: NOT DETECTED
Barbiturates: NOT DETECTED
Benzodiazepines: NOT DETECTED
Cocaine: NOT DETECTED
Opiates: NOT DETECTED
Tetrahydrocannabinol: NOT DETECTED

## 2013-12-01 LAB — ETHANOL: Alcohol, Ethyl (B): <11 mg/dL (ref 0–11)

## 2013-12-01 MED ORDER — METOCLOPRAMIDE HCL 5 MG/ML IJ SOLN
10.0000 mg | Freq: Once | INTRAMUSCULAR | Status: AC
  Administered 2013-12-01: 10 mg via INTRAVENOUS
  Filled 2013-12-01: qty 2

## 2013-12-01 MED ORDER — KETOROLAC TROMETHAMINE 30 MG/ML IJ SOLN
30.0000 mg | Freq: Once | INTRAMUSCULAR | Status: AC
  Administered 2013-12-01: 30 mg via INTRAVENOUS
  Filled 2013-12-01: qty 1

## 2013-12-01 MED ORDER — DIPHENHYDRAMINE HCL 50 MG/ML IJ SOLN
25.0000 mg | Freq: Once | INTRAMUSCULAR | Status: AC
  Administered 2013-12-01: 25 mg via INTRAVENOUS
  Filled 2013-12-01: qty 1

## 2013-12-01 MED ORDER — PREGABALIN 75 MG PO CAPS: 75.0000 mg | ORAL_CAPSULE | Freq: Two times a day (BID) | ORAL | Status: AC

## 2013-12-01 MED ORDER — SERTRALINE HCL 50 MG PO TABS: 50.0000 mg | ORAL_TABLET | Freq: Every day | ORAL | Status: AC

## 2013-12-01 MED ORDER — SODIUM CHLORIDE 0.9 % IV BOLUS (SEPSIS)
1000.0000 mL | Freq: Once | INTRAVENOUS | Status: AC
  Administered 2013-12-01: 1000 mL via INTRAVENOUS

## 2013-12-01 NOTE — ED Notes (Signed)
Pt reports having seizures and headache

## 2013-12-01 NOTE — ED Notes (Signed)
Pt walked to door of triage room and said "I need help".  Pt began blinking her eyes and leaning against the doorpost and began shaking all over.  I immediately went to pt, called for help and myself and another staff member eased the pt into a wheelchair.  Pt lay in the chair with her eyes closed.  I began asking triage questions, noting that the pt had been seen by neurology in the past.  Pt said, yes, she had.  Then she stated I needed to give her a moment because she had a seizure.  She started yelling and cursing at me.  Security came.

## 2013-12-01 NOTE — Discharge Instructions (Signed)
You should stop taking the Wellbutrin (bupropion) as this can increase your seizure frequency. Be sure to follow up with neurology for your headache and seizures.   General Headache Without Cause A headache is pain or discomfort felt around the head or neck area. The specific cause of a headache may not be found. There are many causes and types of headaches. A few common ones are:  Tension headaches.  Migraine headaches.  Cluster headaches.  Chronic daily headaches. HOME CARE INSTRUCTIONS   Keep all follow-up appointments with your caregiver or any specialist referral.  Only take over-the-counter or prescription medicines for pain or discomfort as directed by your caregiver.  Lie down in a dark, quiet room when you have a headache.  Keep a headache journal to find out what may trigger your migraine headaches. For example, write down:  What you eat and drink.  How much sleep you get.  Any change to your diet or medicines.  Try massage or other relaxation techniques.  Put ice packs or heat on the head and neck. Use these 3 to 4 times per day for 15 to 20 minutes each time, or as needed.  Limit stress.  Sit up straight, and do not tense your muscles.  Quit smoking if you smoke.  Limit alcohol use.  Decrease the amount of caffeine you drink, or stop drinking caffeine.  Eat and sleep on a regular schedule.  Get 7 to 9 hours of sleep, or as recommended by your caregiver.  Keep lights dim if bright lights bother you and make your headaches worse. SEEK MEDICAL CARE IF:   You have problems with the medicines you were prescribed.  Your medicines are not working.  You have a change from the usual headache.  You have nausea or vomiting. SEEK IMMEDIATE MEDICAL CARE IF:   Your headache becomes severe.  You have a fever.  You have a stiff neck.  You have loss of vision.  You have muscular weakness or loss of muscle control.  You start losing your balance or have  trouble walking.  You feel faint or pass out.  You have severe symptoms that are different from your first symptoms. MAKE SURE YOU:   Understand these instructions.  Will watch your condition.  Will get help right away if you are not doing well or get worse. Document Released: 03/16/2005 Document Revised: 06/08/2011 Document Reviewed: 04/01/2011 [facility] Patient Information 2015 [place], [place]. This information is not intended to replace advice given to you by your health care provider. Make sure you discuss any questions you have with your health care provider.   Seizure, Adult A seizure is abnormal electrical activity in the brain. Seizures usually last from 30 seconds to 2 minutes. There are various types of seizures. Before a seizure, you may have a warning sensation (aura) that a seizure is about to occur. An aura may include the following symptoms:   Fear or anxiety.  Nausea.  Feeling like the room is spinning (vertigo).  Vision changes, such as seeing flashing lights or spots. Common symptoms during a seizure include:  A change in attention or behavior (altered mental status).  Convulsions with rhythmic jerking movements.  Drooling.  Rapid eye movements.  Grunting.  Loss of bladder and bowel control.  Bitter taste in the mouth.  Tongue biting. After a seizure, you may feel confused and sleepy. You may also have an injury resulting from convulsions during the seizure. HOME CARE INSTRUCTIONS   If you are given  medicines, take them exactly as prescribed by your health care provider.  Keep all follow-up appointments as directed by your health care provider.  Do not swim or drive or engage in risky activity during which a seizure could cause further injury to you or others until your health care provider says it is OK.  Get adequate rest.  Teach friends and family what to do if you have a seizure. They should:  Lay you on the ground to prevent a  fall.  Put a cushion under your head.  Loosen any tight clothing around your neck.  Turn you on your side. If vomiting occurs, this helps keep your airway clear.  Stay with you until you recover.  Know whether or not you need emergency care. SEEK IMMEDIATE MEDICAL CARE IF:  The seizure lasts longer than 5 minutes.  The seizure is severe or you do not wake up immediately after the seizure.  You have an altered mental status after the seizure.  You are having more frequent or worsening seizures. Someone should drive you to the emergency department or call local emergency services (911 in [place] MAKE SURE YOU:  Understand these instructions.  Will watch your condition.  Will get help right away if you are not doing well or get worse. Document Released: 03/13/2000 Document Revised: 01/04/2013 Document Reviewed: 10/26/2012 [facility] Patient Information 2015 [place], [place]. This information is not intended to replace advice given to you by your health care provider. Make sure you discuss any questions you have with your health care provider.

## 2013-12-01 NOTE — ED Provider Notes (Signed)
CSN: [id number]     Arrival date & time 12/01/13  1438 History   First MD Initiated Contact with Patient 12/01/13 1600     Chief Complaint  Patient presents with  . Seizures     (Consider location/radiation/quality/duration/timing/severity/associated sxs/prior Treatment) HPI 53 year old female presents with a chief complaint of having a headache. Patient was going to be sent back to triage and states she then had a "seizure" that nursing staff states did not appear like a normal seizure. She then said "now you can't send me back to triage, I just had a seizure". She was apparently yelling vs staff and eventually agreed to go to a room when escorted by a Emergency planning/management officer. Patient reports she is angry at the nurse because the nurse was calling for ammonia capsules during her seizure. She states she heard everything that happened during her generalized seizure. Patient relates she's had seizure for 5+ years. Has been having a headache for the past 4 days, relates a past history of migraines and that this is similar. When I ask about the course of the headache and different factors, she gets angry and asks if I'm even listening and begins to yell. Intermittently calms down and answers some questions. Relates this AM she also had a seizure, and had left eye blinking and 2 words repeating in her head over and over (does not recall the words) prior to a seizure. She has felt "sick" all week but is unable to explain what this means besides the headache and vomiting.   Past Medical History  Diagnosis Date  . Thyroid disease   . Seizures   . Anxiety   . Migraine   . Headache(784.0) 02/01/2013  . Depression   . Hypertension    Past Surgical History  Procedure Laterality Date  . Abdominal hysterectomy    . Cholecystectomy    . Breast lumpectomy     Family History  Problem Relation Age of Onset  . Adopted: Yes   History  Substance Use Topics  . Smoking status: Current Every Day Smoker  . Smokeless  tobacco: Never Used  . Alcohol Use: No   OB History   Grav Para Term Preterm Abortions TAB SAB Ect Mult Living                 Review of Systems  Eyes: Negative for visual disturbance.  Cardiovascular: Negative for chest pain.  Gastrointestinal: Positive for vomiting. Negative for abdominal pain.  Neurological: Positive for seizures and headaches.  All other systems reviewed and are negative.     Allergies  Penicillins and Sulfa antibiotics  Home Medications   Prior to Admission medications   Medication Sig Start Date End Date Taking? Authorizing Provider  aspirin-acetaminophen-caffeine (EXCEDRIN MIGRAINE) [phone] MG per tablet Take 1 tablet by mouth every 6 (six) hours as needed for headache. Also taking it for back pain per patient    Historical Provider, MD  baclofen (LIORESAL) 20 MG tablet Take 20 mg by mouth once.     Historical Provider, MD  buPROPion (WELLBUTRIN XL) 300 MG 24 hr tablet Take 300 mg by mouth daily.    Historical Provider, MD  HYDROcodone-acetaminophen (NORCO/VICODIN) 5-325 MG per tablet Take 1 tablet by mouth every 4 (four) hours as needed for moderate pain. 07/25/13   Hayden Rasmussen, NP  levothyroxine (SYNTHROID, LEVOTHROID) 75 MCG tablet Take 75 mcg by mouth daily before breakfast.    Historical Provider, MD  losartan (COZAAR) 100 MG tablet Take 100  mg by mouth at bedtime.    Historical Provider, MD  Melatonin 5 MG TABS Take 1 tablet by mouth at bedtime.    Historical Provider, MD  methocarbamol (ROBAXIN) 500 MG tablet Take 1 tablet (500 mg total) by mouth every 8 (eight) hours as needed for muscle spasms. 10/11/13   Mercedes Strupp Camprubi-Soms, PA-C  naproxen (NAPROSYN) 500 MG tablet Take 1 tablet (500 mg total) by mouth 2 (two) times daily as needed for mild pain, moderate pain or headache (TAKE WITH MEALS.). 10/11/13   Mercedes Strupp Camprubi-Soms, PA-C  oxyCODONE-acetaminophen (PERCOCET) 5-325 MG per tablet Take 1-2 tablets by mouth every 6 (six) hours as  needed for severe pain. 10/11/13   Mercedes Strupp Camprubi-Soms, PA-C  prazosin (MINIPRESS) 1 MG capsule Take 1 mg by mouth at bedtime.    Historical Provider, MD  propranolol (INDERAL) 80 MG tablet Take 80 mg by mouth at bedtime.    Historical Provider, MD  topiramate (TOPAMAX) 200 MG tablet Take 200 mg by mouth 2 (two) times daily.    Historical Provider, MD  zolpidem (AMBIEN) 10 MG tablet Take 5 mg by mouth at bedtime.    Historical Provider, MD   BP 111/70  Pulse 79  Temp(Src) 98.5 F (36.9 C) (Oral)  Resp 16  SpO2 96% Physical Exam  Nursing note and vitals reviewed. Constitutional: She is oriented to person, place, and time. She appears well-developed and well-nourished.  HENT:  Head: Normocephalic and atraumatic.  Right Ear: External ear normal.  Left Ear: External ear normal.  Nose: Nose normal.  Eyes: EOM are normal. Pupils are equal, round, and reactive to light. Right eye exhibits no discharge. Left eye exhibits no discharge.  Cardiovascular: Normal rate, regular rhythm and normal heart sounds.   Pulmonary/Chest: Effort normal and breath sounds normal.  Abdominal: Soft. She exhibits no distension. There is no tenderness.  Neurological: She is alert and oriented to person, place, and time. She has normal strength. No cranial nerve deficit or sensory deficit.  CN 2-12 grossly intact. 5/5 strength in all 4 extremities. Normal gross sensation in all extremities.  Skin: Skin is warm and dry.  Psychiatric: Her affect is angry and labile.    ED Course  Procedures (including critical care time) Labs Review Labs Reviewed  COMPREHENSIVE METABOLIC PANEL - Abnormal; Notable for the following:    AST 44 (*)    ALT 41 (*)    Total Bilirubin <0.2 (*)    GFR calc non Af Amer 71 (*)    GFR calc Af Amer 83 (*)    All other components within normal limits  URINALYSIS, ROUTINE W REFLEX MICROSCOPIC - Abnormal; Notable for the following:    APPearance CLOUDY (*)    All other components  within normal limits  CBC WITH DIFFERENTIAL  URINE RAPID DRUG SCREEN (HOSP PERFORMED)  ETHANOL    Imaging Review Ct Head Wo Contrast  12/01/2013   CLINICAL DATA:  Seizures with headache, left-sided droop and slurred speech. History of seizures and hypertension.  EXAM: CT HEAD WITHOUT CONTRAST  TECHNIQUE: Contiguous axial images were obtained from the base of the skull through the vertex without intravenous contrast.  COMPARISON:  None.  FINDINGS: There is no evidence of acute intracranial hemorrhage, mass lesion, brain edema or extra-axial fluid collection. The ventricles and subarachnoid spaces are appropriately sized for age. There is no CT evidence of acute cortical infarction.  The visualized paranasal sinuses, mastoid air cells and middle ears are clear. The calvarium is  intact.  IMPRESSION: Unremarkable noncontrast head CT.  No acute intracranial findings.   Electronically Signed   By: Bill  Veazey M.D.   ORoxy Horseman2015 18:08     EKG Interpretation   Date/Time:  Friday December 01 2013 14:58:23 EDT Ventricular Rate:  87 PR Interval:  164 QRS Duration: 76 QT Interval:  372 QTC Calculation: 447 R Axis:   73 Text Interpretation:  Normal sinus rhythm Right atrial enlargement  Nonspecific T wave abnormality Abnormal ECG nonspecific T waves new from  2014 Confirmed by Aminah Zabawa  MD, Moreen Piggott ([id number]) on 12/01/2013 4:02:32 PM      MDM   Final diagnoses:  Acute nonintractable headache, unspecified headache type    Patient's headache is of uncertain etiology. She has no focal neurologic symptoms on exam and I have low social for stroke, meningitis, subarachnoid hemorrhage or clot. Patient's CT is negative for acute bleed. She is well-appearing here. She does intermittently get agitated but seems to be mostly pain related. She does have a history of bipolar but does not appear to be acutely psychotic. She is able to be calmed down. I did not witness any seizure activity here. The seizure that  she had in triage sounds like it was nonepileptic based on the nursing staff that witnessed it. I discussed her case with the neurologist on call, Dr. Amada Jupiter, who recommends starting Lyrica in place of her Neurontin in the past as well as switching her Wellbutrin to Zoloft as this can lower seizure threshold. Her headache seemed improved in the ED. Given all this I feel she is stable for outpatient treatment. Oh recommend she followup with her neurologist, in [facility] neurology.    Audree Camel, MD 12/01/13 [phone]

## 2013-12-05 ENCOUNTER — Ambulatory Visit: Payer: Medicaid Other

## 2014-03-27 IMAGING — CR DG CHEST 2V
2 series · 2 of 2 positions shown · non-contrast
Comparison: None.

CLINICAL DATA: Upper respiratory symptoms, error 8, and history of
tobacco use

EXAM:
CHEST  2 VIEW

[w chest pa]
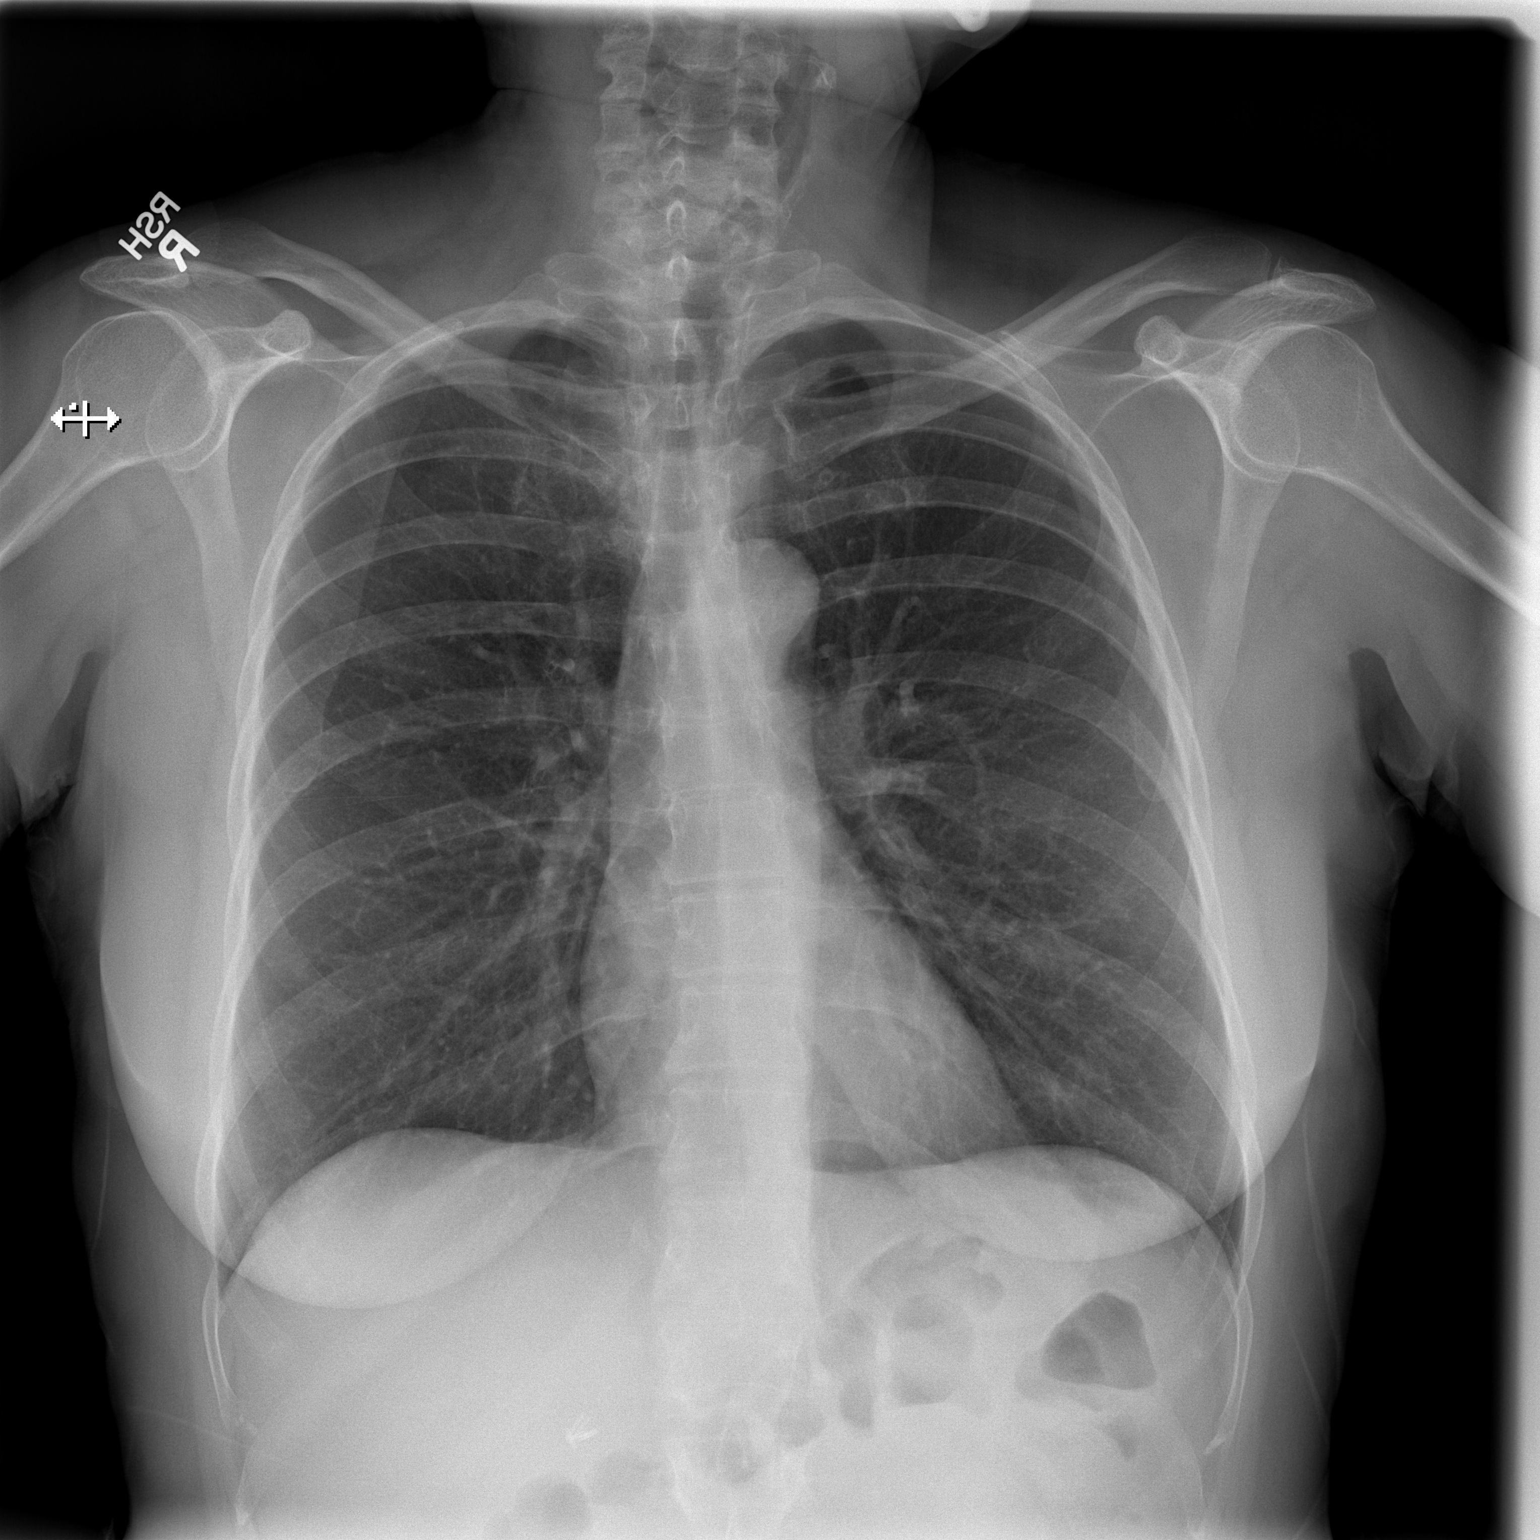

[w chest lat]
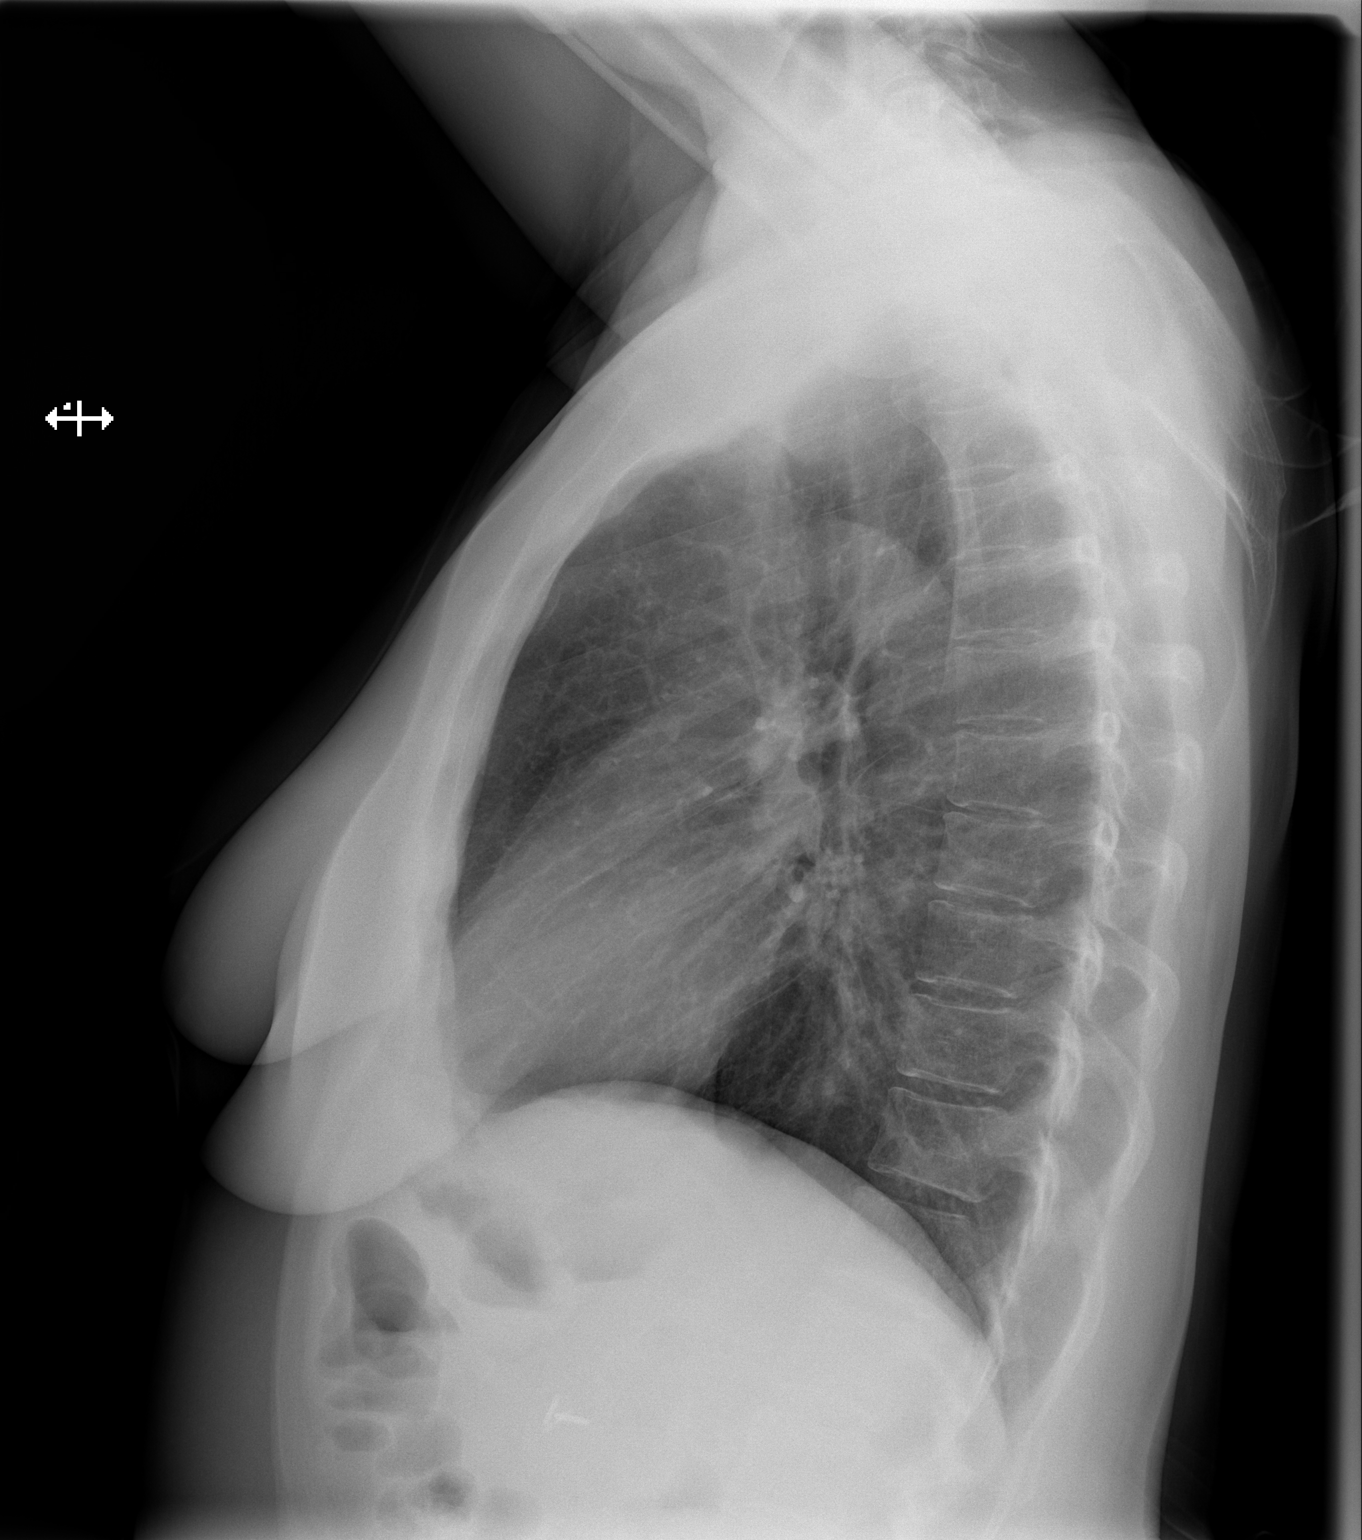

[2 of 2 positions shown; findings below may reference images not displayed]

FINDINGS: The lungs are borderline hyperinflated and clear. The
cardiopericardial silhouette is normal in size. The mediastinum is
normal in width. The pulmonary vascularity is not engorged. There is
no pleural effusion or pneumothorax or pneumomediastinum. The
observed portions of the bony thorax appear normal. Very gentle
upper thoracic spine S-shaped curvature is present with the
convexity towards the left.
IMPRESSION: There is no evidence of pneumonia nor CHF. Borderline hyperinflation
may reflect acute bronchitis in the appropriate clinical setting.

## 2014-12-18 IMAGING — CT CT HEAD W/O CM
2 series · 16 of 30 positions shown, 20 images · non-contrast
Comparison: None.

CLINICAL DATA: Seizures with headache, left-sided droop and slurred
speech. History of seizures and hypertension.

EXAM:
CT HEAD WITHOUT CONTRAST
TECHNIQUE: Contiguous axial images were obtained from the base of the skull
through the vertex without intravenous contrast.

[Series 201: head w/o, idose (1) · axial · non-contrast · 0.43mm/px · z∈[+80,+205]mm · 13 of 31 slices shown, 17 images]
[im 3/31  brain]
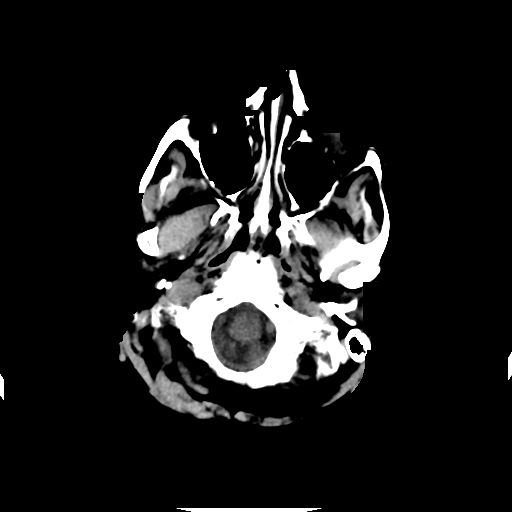
[im 3/31  bone]
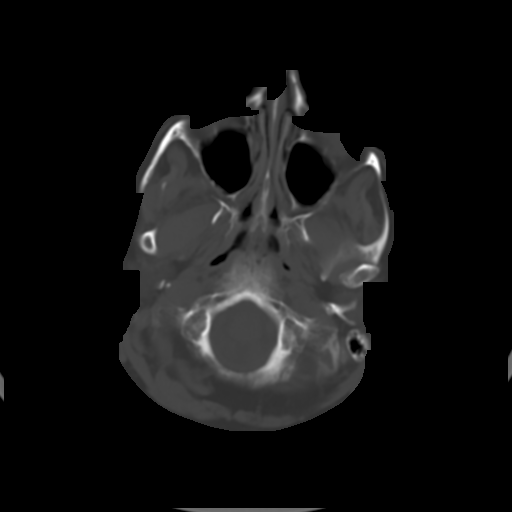
[im 5/31  brain]
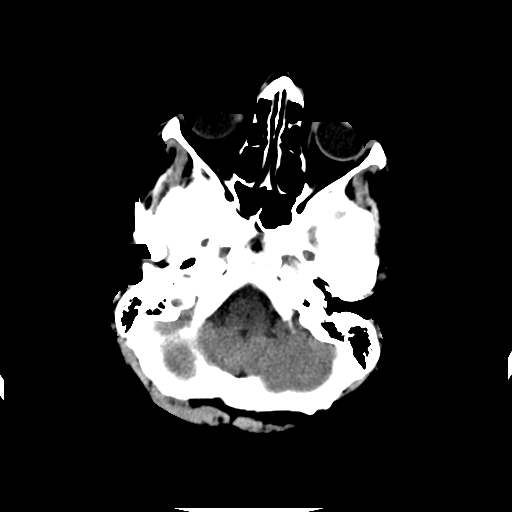
[im 7/31  brain]
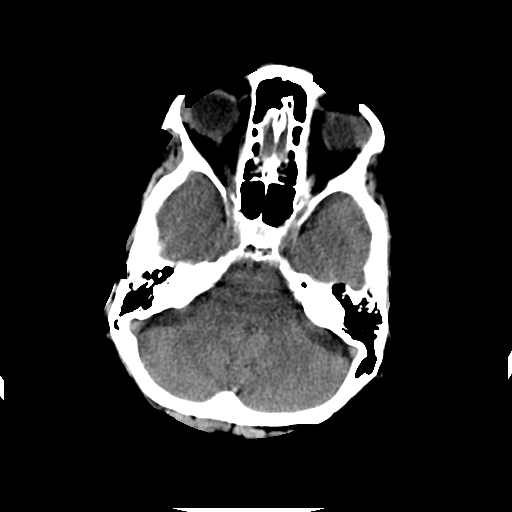
[im 9/31  brain]
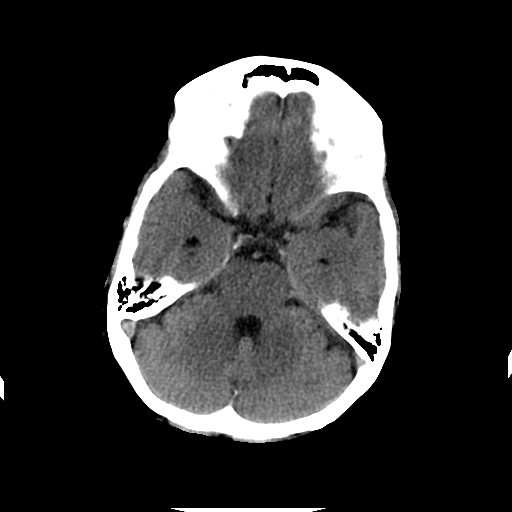
[im 11/31  brain]
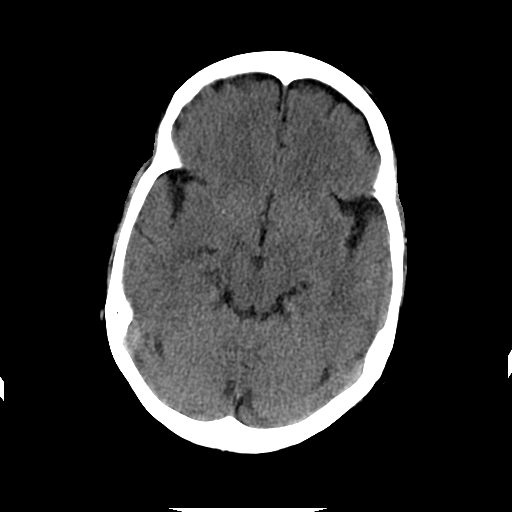
[im 11/31  bone]
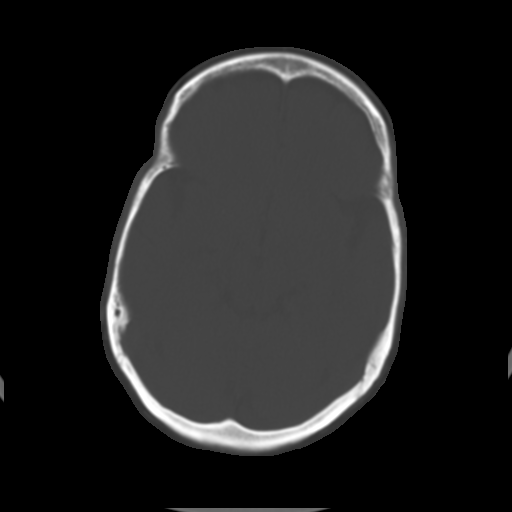
[im 13/31  brain]
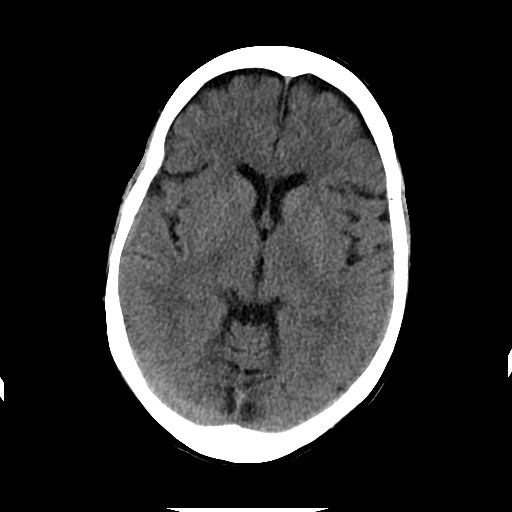
[im 16/31  brain]
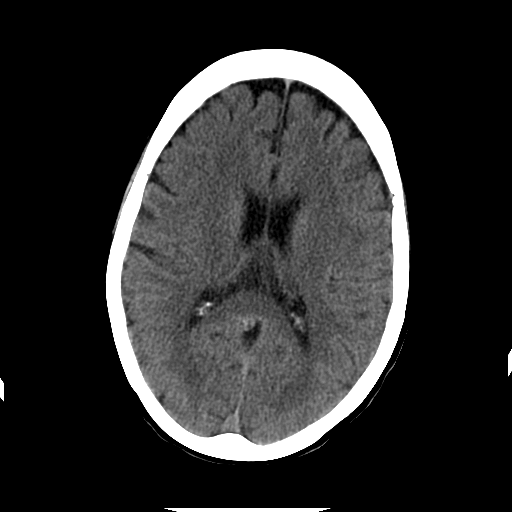
[im 18/31  brain]
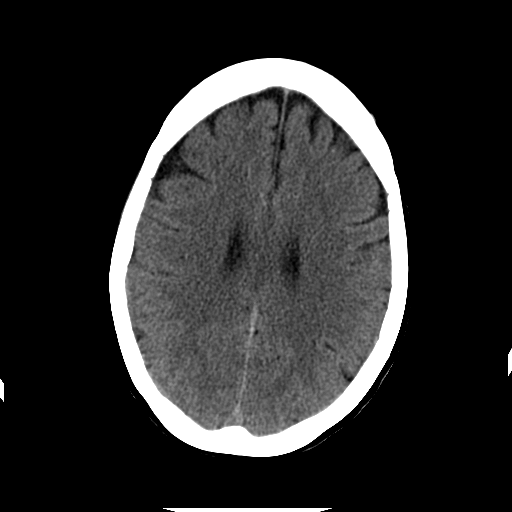
[im 20/31  brain]
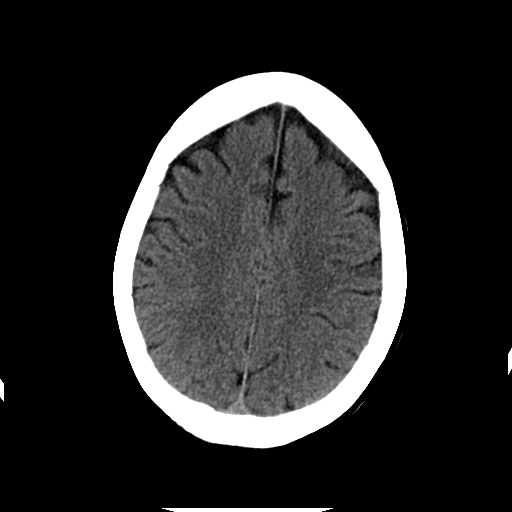
[im 20/31  bone]
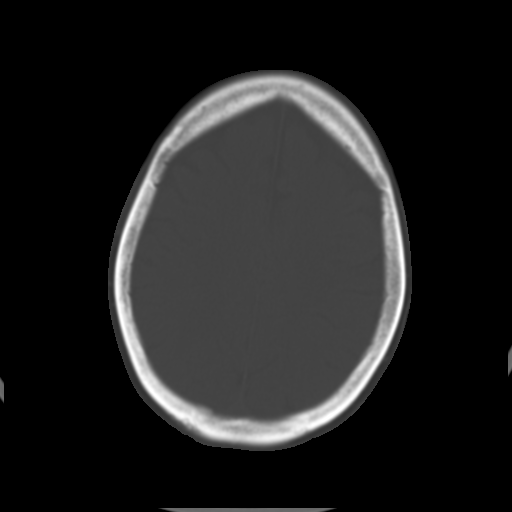
[im 22/31  brain]
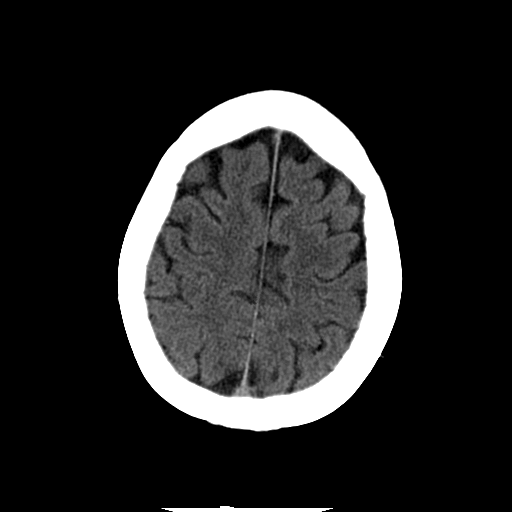
[im 24/31  brain]
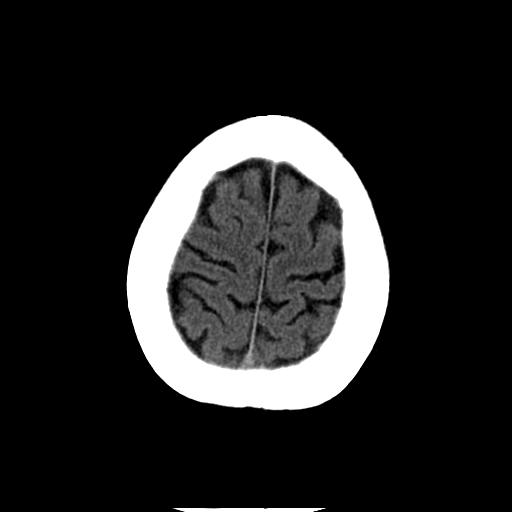
[im 26/31  brain]
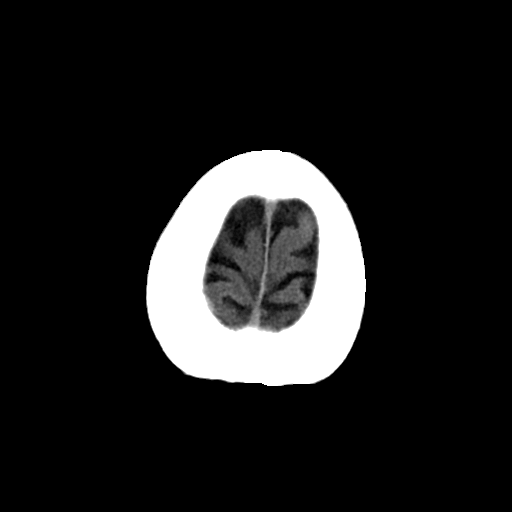
[im 28/31  brain]
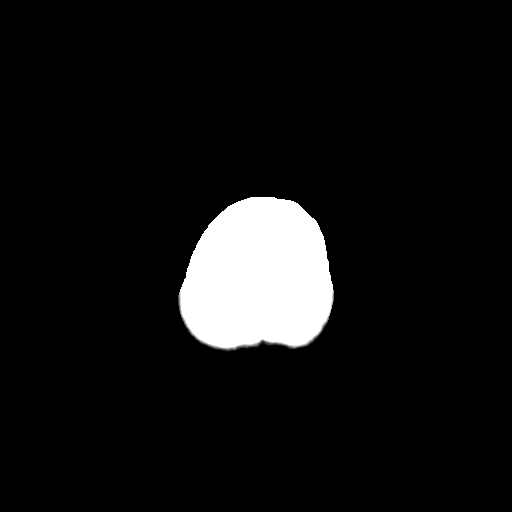
[im 28/31  bone]
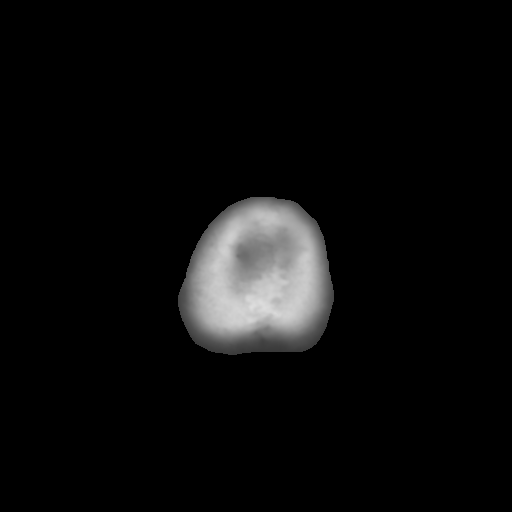

[Series 202: head w/o bone, idose (1) · axial · non-contrast · 0.43mm/px · z∈[+80,+120]mm · 3 of 31 slices shown]
[im 3/31  bone]
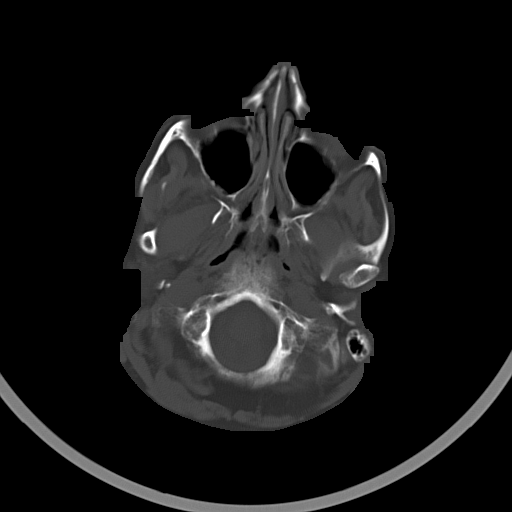
[im 7/31  bone]
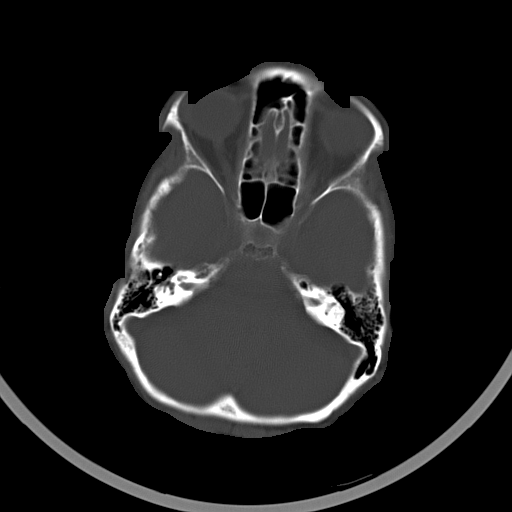
[im 11/31  bone]
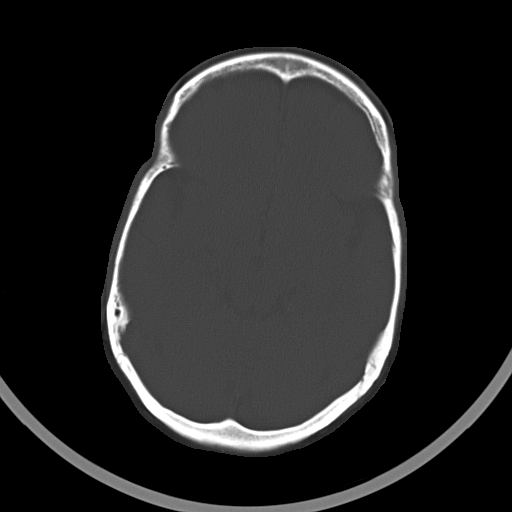

[16 of 30 positions shown; findings below may reference images not displayed]

FINDINGS: There is no evidence of acute intracranial hemorrhage, mass lesion,
brain edema or extra-axial fluid collection. The ventricles and
subarachnoid spaces are appropriately sized for age. There is no CT
evidence of acute cortical infarction.

The visualized paranasal sinuses, mastoid air cells and middle ears
are clear. The calvarium is intact.
IMPRESSION: Unremarkable noncontrast head CT.  No acute intracranial findings.
